# Patient Record
Sex: Female | Born: 1935 | Race: White | Hispanic: No | Marital: Married | Smoking: Former smoker
Health system: Southern US, Community
[De-identification: ages and names within clinical notes are randomized; demographics above are authoritative.]

## PROBLEM LIST (undated history)

## (undated) DIAGNOSIS — K219 Gastro-esophageal reflux disease without esophagitis: Secondary | ICD-10-CM

## (undated) DIAGNOSIS — J310 Chronic rhinitis: Secondary | ICD-10-CM

## (undated) DIAGNOSIS — I499 Cardiac arrhythmia, unspecified: Secondary | ICD-10-CM

## (undated) DIAGNOSIS — M1712 Unilateral primary osteoarthritis, left knee: Secondary | ICD-10-CM

## (undated) DIAGNOSIS — E039 Hypothyroidism, unspecified: Secondary | ICD-10-CM

## (undated) DIAGNOSIS — Z9889 Other specified postprocedural states: Secondary | ICD-10-CM

## (undated) DIAGNOSIS — M1990 Unspecified osteoarthritis, unspecified site: Secondary | ICD-10-CM

## (undated) DIAGNOSIS — I10 Essential (primary) hypertension: Secondary | ICD-10-CM

## (undated) DIAGNOSIS — R112 Nausea with vomiting, unspecified: Secondary | ICD-10-CM

## (undated) HISTORY — DX: Chronic rhinitis: J31.0

## (undated) HISTORY — DX: Essential (primary) hypertension: I10

## (undated) HISTORY — DX: Hypothyroidism, unspecified: E03.9

## (undated) HISTORY — PX: CATARACT EXTRACTION: SUR2

---

## 2001-01-29 ENCOUNTER — Ambulatory Visit (HOSPITAL_COMMUNITY): Admission: RE | Admit: 2001-01-29 | Discharge: 2001-01-29 | Payer: Self-pay | Admitting: Internal Medicine

## 2001-01-29 ENCOUNTER — Encounter: Payer: Self-pay | Admitting: Internal Medicine

## 2001-02-04 ENCOUNTER — Ambulatory Visit (HOSPITAL_COMMUNITY): Admission: RE | Admit: 2001-02-04 | Discharge: 2001-02-04 | Payer: Self-pay | Admitting: Internal Medicine

## 2001-02-04 ENCOUNTER — Encounter: Payer: Self-pay | Admitting: Internal Medicine

## 2001-04-28 ENCOUNTER — Other Ambulatory Visit: Admission: RE | Admit: 2001-04-28 | Discharge: 2001-04-28 | Payer: Self-pay | Admitting: Obstetrics and Gynecology

## 2002-02-15 ENCOUNTER — Ambulatory Visit (HOSPITAL_COMMUNITY): Admission: RE | Admit: 2002-02-15 | Discharge: 2002-02-15 | Payer: Self-pay | Admitting: Specialist

## 2002-02-15 ENCOUNTER — Encounter: Payer: Self-pay | Admitting: Specialist

## 2002-02-16 ENCOUNTER — Encounter: Payer: Self-pay | Admitting: Specialist

## 2002-02-16 ENCOUNTER — Ambulatory Visit (HOSPITAL_COMMUNITY): Admission: RE | Admit: 2002-02-16 | Discharge: 2002-02-16 | Payer: Self-pay | Admitting: Specialist

## 2002-09-15 ENCOUNTER — Encounter: Payer: Self-pay | Admitting: Obstetrics and Gynecology

## 2002-09-15 ENCOUNTER — Ambulatory Visit (HOSPITAL_COMMUNITY): Admission: RE | Admit: 2002-09-15 | Discharge: 2002-09-15 | Payer: Self-pay | Admitting: Obstetrics and Gynecology

## 2002-10-07 HISTORY — PX: TOTAL KNEE ARTHROPLASTY: SHX125

## 2002-12-09 ENCOUNTER — Encounter: Payer: Self-pay | Admitting: Orthopedic Surgery

## 2002-12-15 ENCOUNTER — Inpatient Hospital Stay (HOSPITAL_COMMUNITY): Admission: RE | Admit: 2002-12-15 | Discharge: 2002-12-19 | Payer: Self-pay | Admitting: Orthopedic Surgery

## 2003-05-24 ENCOUNTER — Encounter: Payer: Self-pay | Admitting: Specialist

## 2003-05-24 ENCOUNTER — Ambulatory Visit (HOSPITAL_COMMUNITY): Admission: RE | Admit: 2003-05-24 | Discharge: 2003-05-24 | Payer: Self-pay | Admitting: Specialist

## 2003-08-23 ENCOUNTER — Ambulatory Visit (HOSPITAL_COMMUNITY): Admission: RE | Admit: 2003-08-23 | Discharge: 2003-08-23 | Payer: Self-pay | Admitting: Internal Medicine

## 2004-05-24 ENCOUNTER — Ambulatory Visit (HOSPITAL_COMMUNITY): Admission: RE | Admit: 2004-05-24 | Discharge: 2004-05-24 | Payer: Self-pay | Admitting: Specialist

## 2004-06-15 ENCOUNTER — Ambulatory Visit (HOSPITAL_COMMUNITY): Admission: RE | Admit: 2004-06-15 | Discharge: 2004-06-15 | Payer: Self-pay | Admitting: Internal Medicine

## 2005-06-06 ENCOUNTER — Ambulatory Visit (HOSPITAL_COMMUNITY): Admission: RE | Admit: 2005-06-06 | Discharge: 2005-06-06 | Payer: Self-pay | Admitting: Specialist

## 2005-07-03 ENCOUNTER — Ambulatory Visit (HOSPITAL_COMMUNITY): Admission: RE | Admit: 2005-07-03 | Discharge: 2005-07-03 | Payer: Self-pay | Admitting: Specialist

## 2006-01-10 ENCOUNTER — Ambulatory Visit (HOSPITAL_COMMUNITY): Admission: RE | Admit: 2006-01-10 | Discharge: 2006-01-10 | Payer: Self-pay | Admitting: Internal Medicine

## 2006-07-14 ENCOUNTER — Ambulatory Visit (HOSPITAL_COMMUNITY): Admission: RE | Admit: 2006-07-14 | Discharge: 2006-07-14 | Payer: Self-pay | Admitting: Obstetrics and Gynecology

## 2007-07-28 ENCOUNTER — Ambulatory Visit (HOSPITAL_COMMUNITY): Admission: RE | Admit: 2007-07-28 | Discharge: 2007-07-28 | Payer: Self-pay | Admitting: Obstetrics and Gynecology

## 2007-08-07 ENCOUNTER — Encounter (HOSPITAL_COMMUNITY): Admission: RE | Admit: 2007-08-07 | Discharge: 2007-09-06 | Payer: Self-pay | Admitting: Orthopedic Surgery

## 2007-09-15 ENCOUNTER — Encounter (HOSPITAL_COMMUNITY): Admission: RE | Admit: 2007-09-15 | Discharge: 2007-10-07 | Payer: Self-pay | Admitting: Orthopedic Surgery

## 2007-11-16 ENCOUNTER — Other Ambulatory Visit: Admission: RE | Admit: 2007-11-16 | Discharge: 2007-11-16 | Payer: Self-pay | Admitting: Obstetrics and Gynecology

## 2008-03-17 ENCOUNTER — Ambulatory Visit (HOSPITAL_COMMUNITY): Admission: RE | Admit: 2008-03-17 | Discharge: 2008-03-17 | Payer: Self-pay | Admitting: Internal Medicine

## 2008-08-02 ENCOUNTER — Ambulatory Visit (HOSPITAL_COMMUNITY): Admission: RE | Admit: 2008-08-02 | Discharge: 2008-08-02 | Payer: Self-pay | Admitting: Internal Medicine

## 2008-08-08 ENCOUNTER — Ambulatory Visit (HOSPITAL_COMMUNITY): Admission: RE | Admit: 2008-08-08 | Discharge: 2008-08-08 | Payer: Self-pay | Admitting: Obstetrics and Gynecology

## 2008-11-28 ENCOUNTER — Other Ambulatory Visit: Admission: RE | Admit: 2008-11-28 | Discharge: 2008-11-28 | Payer: Self-pay | Admitting: Obstetrics and Gynecology

## 2009-08-15 ENCOUNTER — Ambulatory Visit (HOSPITAL_COMMUNITY): Admission: RE | Admit: 2009-08-15 | Discharge: 2009-08-15 | Payer: Self-pay | Admitting: Obstetrics and Gynecology

## 2010-03-29 ENCOUNTER — Ambulatory Visit (HOSPITAL_COMMUNITY): Admission: RE | Admit: 2010-03-29 | Discharge: 2010-03-29 | Payer: Self-pay | Admitting: Internal Medicine

## 2010-08-21 ENCOUNTER — Ambulatory Visit (HOSPITAL_COMMUNITY): Admission: RE | Admit: 2010-08-21 | Discharge: 2010-08-21 | Payer: Self-pay | Admitting: Obstetrics and Gynecology

## 2010-10-27 ENCOUNTER — Encounter: Payer: Self-pay | Admitting: Internal Medicine

## 2010-12-19 ENCOUNTER — Other Ambulatory Visit (HOSPITAL_COMMUNITY)
Admission: RE | Admit: 2010-12-19 | Discharge: 2010-12-19 | Disposition: A | Discharge status: Home / Self Care | Payer: Medicare Other | Source: Ambulatory Visit | Attending: Obstetrics and Gynecology | Admitting: Obstetrics and Gynecology

## 2010-12-19 ENCOUNTER — Other Ambulatory Visit: Payer: Self-pay | Admitting: Adult Health

## 2010-12-19 DIAGNOSIS — Z124 Encounter for screening for malignant neoplasm of cervix: Secondary | ICD-10-CM | POA: Insufficient documentation

## 2011-02-22 NOTE — Op Note (Signed)
Evelyn Walker, Evelyn Walker                            ACCOUNT NO.:  [id number]   MEDICAL RECORD NO.:  [id number]                   PATIENT TYPE:  INP   LOCATION:  2550                                 FACILITY:  [facility]   PHYSICIAN:  Robert A. Thurston Hole, M.D.              DATE OF BIRTH:  12/29/1934   DATE OF PROCEDURE:  12/15/2002  DATE OF DISCHARGE:                                 OPERATIVE REPORT   PREOPERATIVE DIAGNOSIS:  Right knee degenerative joint disease.   POSTOPERATIVE DIAGNOSIS:  Right knee degenerative joint disease.   PROCEDURE:  Right total knee replacement using Osteonix Scorpio total knee  system with #7 cemented femoral component, #5 cemented tibial component with  12-mm polyethylene flex tibial spacer, 26-mm polyethylene cemented patella.   SURGEON:  Elana Alm. Thurston Hole, M.D.   ASSISTANT:  Julien Girt, P.A.   ANESTHESIA:  General.   OPERATIVE TIME:  One hour 20 minutes.   COMPLICATIONS:  None.   DESCRIPTION OF PROCEDURE:  The patient was brought to the operating room on  12/15/02 and placed on the operative table in supine position.  A femoral  nerve block had been placed preoperatively by anesthesia in the holding  room.  After being placed under general anesthesia, her right knee was  examined under anesthesia.  She had approximately 12 to 14 degrees of valgus  deformity and range of motion 0 to 125 degrees.  Knee stable to ligamentous  exam.  She had a Foley catheter placed under sterile conditions and received  Ancef 1 g IV preoperatively for prophylaxis.  Right leg was prepped using  sterile DuraPrep and draped using sterile technique.  Leg was exsanguinated  and a thigh tourniquet elevated 375 mm.  Initially, through a 20 cm  longitudinal incision based over the patella, initial exposure was made.  Underlying subcutaneous tissues were incised along with skin incision.  A  median arthrotomy was performed revealing an excessive amount of normal-  appearing  joint fluid.  The articular surfaces were inspected.  She had  grade 3 changes medially, grade 4 changes laterally, grade 3 and 4 changes  in the patellofemoral joint.  The medial and lateral meniscal remnants were  removed as well as anterior crucial ligament.  Osteophytes were removed off  the femoral condyle and tibial plateau.  An intermedullary drill was drilled  over the femoral canal for placement of the distal femoral cutting jig which  was placed in the appropriate amount of rotation and a distal 10 mm cut was  made.  The distal femur was sized.  #7 was found to be the appropriate size  and #7 cutting jig was placed and then these cuts were made.  The proximal  tibia was then exposed.  The tibial spines were removed with an oscillating  saw.  Intermedullary drill was drilled down the tibial canal for placement  of the proximal tibial cutting  jig which was placed in the appropriate  amount of rotation and a proximal 4 mm cut was made based off the lateral or  lower side.  After this was done, the proximal tibia was sized.  #5 was  found to be the appropriate size.  At this point then, the #7 femoral trial  was placed and #5 tibial base plate trial was placed and a with a 12-mm  polyethylene spacer there was found to be excellent restoration of normal  alignment, excellent stability through a full range of motion.  The tibial  base plate was then marked for rotation and the keel cut was made.  After  this was done, the patella was sized.  26 mm was found to be the appropriate  size and a recessed 10 mm x 26 mm cut was made and three locking holes were  placed.  After this was done, it was felt that all the trial components were  of excellent size, fit and stability.  They were removed.  The knee was then  jet lavage  irrigated with three liters of saline solution.  The proximal  tibia was then exposed.  The #5 tibial base plate with cement backing was  then hammered into position with  an excellent fit.  The #7 femoral component  with cement backing was hammered into position also with an excellent fit.  The 12-mm polyethylene spacer was locked on the tibial base plate. The knee  taken through range of motion 0 to 120 degrees with excellent stability and  excellent correction of her valgus deformity to approximately 5 to 7 degrees  of natural valgus inclination.  The 26-mm patella button with cement backing  was placed into its recessed hole and locked in position with a clamp.  After cement hardened, patellofemoral tracking was evaluated and this was  found to be normal.  At this point, it was felt that all the components were  of excellent size, fit and stability.  The knee was further irrigated with  antibiotic solution.  The median arthrotomy was closed with #1 Ethibond  suture over two medium Hemovac drains.  Subcutaneous tissue was closed 0 and  2-0 Vicryl.  Skin closed with skin staples.  Sterile dressings were applied.  The Hemovac injected with 0.25% Marcaine and epinephrine and clamped.  The  patient then awakened and taken to the recovery room in stable condition.  Needle and sponge counts correct x2 at the end of the case.                                                Robert A. Thurston Hole, M.D.    RAW/MEDQ  D:  12/15/2002  T:  12/15/2002  Job:  [id number]

## 2011-02-22 NOTE — Op Note (Signed)
NAME:  Evelyn Walker, Evelyn Walker                            ACCOUNT NO.:  [id number]   MEDICAL RECORD NO.:  [id number]                   PATIENT TYPE:  AMB   LOCATION:  DAY                                  FACILITY:  [facility]   PHYSICIAN:  Lionel December, M.D.                 DATE OF BIRTH:  Aug 02, 1935   DATE OF PROCEDURE:  06/15/2004  DATE OF DISCHARGE:                                 OPERATIVE REPORT   PROCEDURE:  Total colonoscopy with polypectomy.   INDICATIONS FOR PROCEDURE:  Beth is a 76 year old Caucasian female who had  a colonoscopy six years ago in [place] and had one polyp removed.  She is  here for surveillance exam.  Family history is negative for colorectal  carcinoma, and she does not have any GI symptoms.  The procedure risks were  reviewed with the patient, and informed consent was obtained.   PREOPERATIVE MEDICATIONS:  Demerol 50 mg IV, Versed 8 mg IV in divided dose.   FINDINGS:  The procedure was performed in the endoscopy suite.  The  patient's vital signs and O2 saturations were monitored during the procedure  and remained stable.  The patient was placed in the left lateral recumbent  position and rectal examination performed.  No abnormality noted on external  or digital exam.  The Olympus videoscope was placed into the rectum and  advanced into the region of the sigmoid colon and beyond.  She had scattered  diverticula at the sigmoid and descending colon.  These were small to  moderate size.  The preparation was satisfactory.  The scope was then  advanced into the cecum which was identified by the appendiceal orifice and  ileocecal valve.  There was a sessile polyp located to the right of the  appendiceal orifice.  This was over 10 mm in diameter.  This polyp was  raised with a submucosa injection of normal saline, trying to stay away from  the appendiceal orifice.  The polyp was removed in two pieces.  The  polypectomy was felt to be complete.  Both of these pieces of  polyp were  retrieved for histologic examination.  As the scope was withdrawn, the rest  of the colonic mucosa was examined for the second time, and there were no  other abnormalities.  The rectal mucosa similarly was normal.  The scope was  retroflexed to examine the anorectal junction, and she had a prominent anal  papilla.  The endoscope was straightened and withdrawn.  The patient  tolerated the procedure well.   FINAL DIAGNOSES:  1.  Sessile cecal polyp located next to appendiceal orifice which was snared      piecemeal after submucosa injection of normal saline.  Polypectomy felt      to be complete.  2.  Left colonic diverticulosis.  3.  Prominent anal papilla.   RECOMMENDATIONS:  1.  Standard instructions given.  2.  I will be contacting the patient with the biopsy results and further      recommendations.  If this polyp is an adenoma, I would consider bringing      her back in six months from now.      ___________________________________________                                            Lionel December, M.D.   NR/MEDQ  D:  06/15/2004  T:  06/15/2004  Job:  [id number]   cc:   Madelin Rear. Sherwood Gambler, M.D.  [address]  [place]  [place] [address]  Fax: [phone]

## 2011-02-22 NOTE — Discharge Summary (Signed)
NAMEBANESSA, MAO                            ACCOUNT NO.:  [id number]   MEDICAL RECORD NO.:  [id number]                   PATIENT TYPE:  INP   LOCATION:  5009                                 FACILITY:  [facility]   PHYSICIAN:  Robert A. Thurston Hole, M.D.              DATE OF BIRTH:  12/13/1934   DATE OF ADMISSION:  12/15/2002  DATE OF DISCHARGE:  12/19/2002                                 DISCHARGE SUMMARY   ADMISSION DIAGNOSES:  1. End-stage degenerative joint disease, right knee.  2. Hypothyroidism.   DISCHARGE DIAGNOSES:  1. End-stage degenerative joint disease, right knee.  2. Hypothyroidism.   HISTORY OF PRESENT ILLNESS:  The patient is a 76 year old white female with  a history of end-stage degenerative joint disease of her right knee.  She  tried antiinflammatories, cortisone injections, all without success.  She  has pain at night, pain with rest, unrelieved by any medication.  She  understands the risks, benefits and possible complications of a right total  knee replacement and is without question.   PROCEDURES INHOUSE:  On 12/15/02 the patient underwent a right total knee  replacement by Dr. Thurston Hole.  Anesthesia was consulted to place a right leg  femoral nerve block.  Postoperative day zero she was placed on a CPM for  eight hours a day, zero to 50 degrees.  Postoperative day one hemoglobin was  9.1, she was metabolically stable.  Her INR was 1.1.  Surgical wound was  well approximated.  Her drain was removed.  Her dressing was changed.  Her  PCA was discontinued and she was placed on Percocet for pain.  Postoperative  day two the patient was doing well, she had some difficulty with pain  control, OxyContin 10 mg one p.o. b.i.d. was added to her regimen.  She was  hypokalemic with a potassium of 3.1 and she was given K-Dur 20 mEq b.i.d.  Postoperative day three the patient was doing very well, significantly  better on OxyContin 10 mg b.i.d.  Her hemoglobin was 8.1, she was  asymptomatic.  She was continued on iron.  She was given a Dulcolax  suppository.  Postoperative day four patient was still constipated, she was  given a Fleet's Enema and discharged to home in stable condition, weight  bearing as tolerated.  She was on a regular diet, surgical wound was clean  and dry.   DISCHARGE MEDICATIONS:  Colace 100 mg one p.o. b.i.d., Senokot-S 2 tablets  p.o. q.a.c. b.i.d., OxyContin 10 mg one p.o. b.i.d., Oxy-IR 5 mg 1-2 tablets  q.3-4h p.r.n. pain, Coumadin 3 mg one tablet daily, iron supplement one  tablet p.o. times one month.   DISPOSITION:  We will see her back in the office in 1.5 weeks to remove  staples, and x-rays.  She will call if she has a temperature greater than  101.5, increased pain, increased drainage.  She will be  receiving Home  Health physical therapy and CPM.  A walker with wheels, and a three-in-one  commode.       Kirstin Shepperson, P.A.                  Robert A. Thurston Hole, M.D.    KS/MEDQ  D:  01/19/2003  T:  01/19/2003  Job:  [id number]

## 2011-09-02 ENCOUNTER — Other Ambulatory Visit: Payer: Self-pay | Admitting: Obstetrics and Gynecology

## 2011-09-02 DIAGNOSIS — Z139 Encounter for screening, unspecified: Secondary | ICD-10-CM

## 2011-09-09 ENCOUNTER — Ambulatory Visit (HOSPITAL_COMMUNITY)
Admission: RE | Admit: 2011-09-09 | Discharge: 2011-09-09 | Disposition: A | Discharge status: Home / Self Care | Payer: Medicare Other | Source: Ambulatory Visit | Attending: Obstetrics and Gynecology | Admitting: Obstetrics and Gynecology

## 2011-09-09 DIAGNOSIS — Z139 Encounter for screening, unspecified: Secondary | ICD-10-CM

## 2011-09-09 DIAGNOSIS — Z1231 Encounter for screening mammogram for malignant neoplasm of breast: Secondary | ICD-10-CM | POA: Insufficient documentation

## 2011-12-26 DIAGNOSIS — D235 Other benign neoplasm of skin of trunk: Secondary | ICD-10-CM | POA: Diagnosis not present

## 2012-02-18 DIAGNOSIS — M2140 Flat foot [pes planus] (acquired), unspecified foot: Secondary | ICD-10-CM | POA: Diagnosis not present

## 2012-02-18 DIAGNOSIS — M2020 Hallux rigidus, unspecified foot: Secondary | ICD-10-CM | POA: Diagnosis not present

## 2012-03-10 DIAGNOSIS — M1710 Unilateral primary osteoarthritis, unspecified knee: Secondary | ICD-10-CM | POA: Diagnosis not present

## 2012-03-31 DIAGNOSIS — M76829 Posterior tibial tendinitis, unspecified leg: Secondary | ICD-10-CM | POA: Diagnosis not present

## 2012-04-06 DIAGNOSIS — E039 Hypothyroidism, unspecified: Secondary | ICD-10-CM | POA: Diagnosis not present

## 2012-04-06 DIAGNOSIS — M1990 Unspecified osteoarthritis, unspecified site: Secondary | ICD-10-CM | POA: Diagnosis not present

## 2012-04-06 DIAGNOSIS — Z79899 Other long term (current) drug therapy: Secondary | ICD-10-CM | POA: Diagnosis not present

## 2012-04-14 DIAGNOSIS — M1990 Unspecified osteoarthritis, unspecified site: Secondary | ICD-10-CM | POA: Diagnosis not present

## 2012-04-14 DIAGNOSIS — E039 Hypothyroidism, unspecified: Secondary | ICD-10-CM | POA: Diagnosis not present

## 2012-04-14 DIAGNOSIS — K219 Gastro-esophageal reflux disease without esophagitis: Secondary | ICD-10-CM | POA: Diagnosis not present

## 2012-05-12 DIAGNOSIS — I10 Essential (primary) hypertension: Secondary | ICD-10-CM | POA: Diagnosis not present

## 2012-05-12 DIAGNOSIS — E7800 Pure hypercholesterolemia, unspecified: Secondary | ICD-10-CM | POA: Diagnosis not present

## 2012-05-12 DIAGNOSIS — E673 Hypervitaminosis D: Secondary | ICD-10-CM | POA: Diagnosis not present

## 2012-05-12 DIAGNOSIS — M159 Polyosteoarthritis, unspecified: Secondary | ICD-10-CM | POA: Diagnosis not present

## 2012-06-09 DIAGNOSIS — M25569 Pain in unspecified knee: Secondary | ICD-10-CM | POA: Diagnosis not present

## 2012-08-07 DIAGNOSIS — Z23 Encounter for immunization: Secondary | ICD-10-CM | POA: Diagnosis not present

## 2012-08-13 DIAGNOSIS — L259 Unspecified contact dermatitis, unspecified cause: Secondary | ICD-10-CM | POA: Diagnosis not present

## 2012-08-13 DIAGNOSIS — D235 Other benign neoplasm of skin of trunk: Secondary | ICD-10-CM | POA: Diagnosis not present

## 2012-08-24 ENCOUNTER — Other Ambulatory Visit (HOSPITAL_COMMUNITY): Payer: Self-pay | Admitting: Internal Medicine

## 2012-08-24 DIAGNOSIS — IMO0001 Reserved for inherently not codable concepts without codable children: Secondary | ICD-10-CM

## 2012-08-26 DIAGNOSIS — H26499 Other secondary cataract, unspecified eye: Secondary | ICD-10-CM | POA: Diagnosis not present

## 2012-09-14 ENCOUNTER — Ambulatory Visit (HOSPITAL_COMMUNITY): Payer: PRIVATE HEALTH INSURANCE

## 2012-09-17 DIAGNOSIS — M25579 Pain in unspecified ankle and joints of unspecified foot: Secondary | ICD-10-CM | POA: Diagnosis not present

## 2012-09-17 DIAGNOSIS — M2040 Other hammer toe(s) (acquired), unspecified foot: Secondary | ICD-10-CM | POA: Diagnosis not present

## 2012-09-17 DIAGNOSIS — M2010 Hallux valgus (acquired), unspecified foot: Secondary | ICD-10-CM | POA: Diagnosis not present

## 2012-09-17 DIAGNOSIS — M79609 Pain in unspecified limb: Secondary | ICD-10-CM | POA: Diagnosis not present

## 2012-09-21 ENCOUNTER — Ambulatory Visit (HOSPITAL_COMMUNITY): Payer: PRIVATE HEALTH INSURANCE

## 2012-09-22 ENCOUNTER — Ambulatory Visit (HOSPITAL_COMMUNITY): Payer: PRIVATE HEALTH INSURANCE

## 2012-10-05 ENCOUNTER — Ambulatory Visit (HOSPITAL_COMMUNITY)
Admission: RE | Admit: 2012-10-05 | Discharge: 2012-10-05 | Disposition: A | Discharge status: Home / Self Care | Payer: Medicare Other | Source: Ambulatory Visit | Attending: Internal Medicine | Admitting: Internal Medicine

## 2012-10-05 DIAGNOSIS — IMO0001 Reserved for inherently not codable concepts without codable children: Secondary | ICD-10-CM

## 2012-10-05 DIAGNOSIS — Z1231 Encounter for screening mammogram for malignant neoplasm of breast: Secondary | ICD-10-CM | POA: Insufficient documentation

## 2012-11-13 DIAGNOSIS — I10 Essential (primary) hypertension: Secondary | ICD-10-CM | POA: Diagnosis not present

## 2012-11-13 DIAGNOSIS — E7800 Pure hypercholesterolemia, unspecified: Secondary | ICD-10-CM | POA: Diagnosis not present

## 2012-11-13 DIAGNOSIS — E673 Hypervitaminosis D: Secondary | ICD-10-CM | POA: Diagnosis not present

## 2012-11-13 DIAGNOSIS — M159 Polyosteoarthritis, unspecified: Secondary | ICD-10-CM | POA: Diagnosis not present

## 2012-11-24 DIAGNOSIS — R6889 Other general symptoms and signs: Secondary | ICD-10-CM | POA: Diagnosis not present

## 2012-11-24 DIAGNOSIS — E559 Vitamin D deficiency, unspecified: Secondary | ICD-10-CM | POA: Diagnosis not present

## 2013-02-24 ENCOUNTER — Encounter: Payer: Self-pay | Admitting: *Deleted

## 2013-02-24 DIAGNOSIS — E039 Hypothyroidism, unspecified: Secondary | ICD-10-CM

## 2013-02-25 ENCOUNTER — Ambulatory Visit (INDEPENDENT_AMBULATORY_CARE_PROVIDER_SITE_OTHER): Payer: Medicare Other | Admitting: Adult Health

## 2013-02-25 ENCOUNTER — Other Ambulatory Visit (HOSPITAL_COMMUNITY)
Admission: RE | Admit: 2013-02-25 | Discharge: 2013-02-25 | Disposition: A | Discharge status: Home / Self Care | Payer: Medicare Other | Source: Ambulatory Visit | Attending: Adult Health | Admitting: Adult Health

## 2013-02-25 ENCOUNTER — Encounter: Payer: Self-pay | Admitting: Adult Health

## 2013-02-25 VITALS — BP 148/90 | HR 80 | Ht 65.0 in | Wt 155.5 lb

## 2013-02-25 DIAGNOSIS — Z1151 Encounter for screening for human papillomavirus (HPV): Secondary | ICD-10-CM | POA: Diagnosis not present

## 2013-02-25 DIAGNOSIS — Z01419 Encounter for gynecological examination (general) (routine) without abnormal findings: Secondary | ICD-10-CM | POA: Insufficient documentation

## 2013-02-25 DIAGNOSIS — Z124 Encounter for screening for malignant neoplasm of cervix: Secondary | ICD-10-CM

## 2013-02-25 DIAGNOSIS — N898 Other specified noninflammatory disorders of vagina: Secondary | ICD-10-CM

## 2013-02-25 DIAGNOSIS — Z1212 Encounter for screening for malignant neoplasm of rectum: Secondary | ICD-10-CM

## 2013-02-25 DIAGNOSIS — E039 Hypothyroidism, unspecified: Secondary | ICD-10-CM

## 2013-02-25 LAB — HEMOCCULT GUIAC POC 1CARD (OFFICE)

## 2013-02-25 NOTE — Patient Instructions (Addendum)
Try luvena Mammogram yearly Physical in 2 years Colonoscopy 2015 Labs at Dr. Ouida Sills

## 2013-02-25 NOTE — Progress Notes (Signed)
Patient ID: Evelyn Walker, female   DOB: May 24, 1935, 78 y.o.   MRN: [id number] History of Present Illness: Evelyn Walker is a 78 year old white female in for her pap and physical.  Current Medications, Allergies, Past Medical History, Past Surgical History, Family History and Social History were reviewed in [employer] [facility] electronic medical record.    Review of Systems: Patient denies any headaches, blurred vision, shortness of breath, chest pain, abdominal pain, problems with bowel movements, urination, or intercourse.But she has some vaginal dryness.she has used replens. She had back discomfort but its better now, no mood changes.  Physical Exam:Blood pressure 148/90, pulse 80, height 5\' 5"  (1.651 m), weight 155 lb 8 oz (70.534 kg). General:  Well developed, well nourished, no acute distress Skin:  Warm and dry Neck:  Midline trachea, normal thyroid, no carotid bruits heard Lungs; Clear to auscultation bilaterally Breast:  No dominant palpable mass, retraction, or nipple discharge Cardiovascular: Regular rate and rhythm Abdomen:  Soft, non tender, no hepatosplenomegaly Pelvic:  External genitalia is normal in appearance for age.  The vagina is atrophic. The cervix is atrophic.Pap performed with HPV.  Uterus is felt to be normal size, shape, and contour.  No adnexal masses or tenderness noted. Rectal: Good sphincter tone, no polyps, or hemorrhoids felt.  Hemoccult negative. Extremities:  No swelling  noted Psych:  Alert and cooperative, seems happy  Impression: Yearly gyn exam Vaginal dryness and atrophy Hypothyroidism  Plan: Physical in 2 years Mammogram yearly  Colonoscopy in 2015 Labs with Dr. Ouida Sills Try Leatrice Jewels for vaginal moisture and to balance ph

## 2013-03-12 DIAGNOSIS — H26499 Other secondary cataract, unspecified eye: Secondary | ICD-10-CM | POA: Diagnosis not present

## 2013-03-19 DIAGNOSIS — M159 Polyosteoarthritis, unspecified: Secondary | ICD-10-CM | POA: Diagnosis not present

## 2013-03-19 DIAGNOSIS — N959 Unspecified menopausal and perimenopausal disorder: Secondary | ICD-10-CM | POA: Diagnosis not present

## 2013-03-19 DIAGNOSIS — I10 Essential (primary) hypertension: Secondary | ICD-10-CM | POA: Diagnosis not present

## 2013-03-19 DIAGNOSIS — E7800 Pure hypercholesterolemia, unspecified: Secondary | ICD-10-CM | POA: Diagnosis not present

## 2013-04-13 DIAGNOSIS — I10 Essential (primary) hypertension: Secondary | ICD-10-CM | POA: Diagnosis not present

## 2013-04-13 DIAGNOSIS — T7840XA Allergy, unspecified, initial encounter: Secondary | ICD-10-CM | POA: Diagnosis not present

## 2013-04-13 DIAGNOSIS — Z79899 Other long term (current) drug therapy: Secondary | ICD-10-CM | POA: Diagnosis not present

## 2013-04-13 DIAGNOSIS — E039 Hypothyroidism, unspecified: Secondary | ICD-10-CM | POA: Diagnosis not present

## 2013-04-13 DIAGNOSIS — M1990 Unspecified osteoarthritis, unspecified site: Secondary | ICD-10-CM | POA: Diagnosis not present

## 2013-04-20 ENCOUNTER — Other Ambulatory Visit (HOSPITAL_COMMUNITY): Payer: Self-pay | Admitting: Internal Medicine

## 2013-04-20 DIAGNOSIS — E039 Hypothyroidism, unspecified: Secondary | ICD-10-CM | POA: Diagnosis not present

## 2013-04-20 DIAGNOSIS — M8580 Other specified disorders of bone density and structure, unspecified site: Secondary | ICD-10-CM

## 2013-04-20 DIAGNOSIS — M1990 Unspecified osteoarthritis, unspecified site: Secondary | ICD-10-CM | POA: Diagnosis not present

## 2013-05-10 ENCOUNTER — Ambulatory Visit (HOSPITAL_COMMUNITY)
Admission: RE | Admit: 2013-05-10 | Discharge: 2013-05-10 | Disposition: A | Discharge status: Home / Self Care | Payer: Medicare Other | Source: Ambulatory Visit | Attending: Internal Medicine | Admitting: Internal Medicine

## 2013-05-10 DIAGNOSIS — Z780 Asymptomatic menopausal state: Secondary | ICD-10-CM | POA: Insufficient documentation

## 2013-05-10 DIAGNOSIS — M899 Disorder of bone, unspecified: Secondary | ICD-10-CM | POA: Diagnosis not present

## 2013-05-10 DIAGNOSIS — M8580 Other specified disorders of bone density and structure, unspecified site: Secondary | ICD-10-CM

## 2013-05-10 DIAGNOSIS — M949 Disorder of cartilage, unspecified: Secondary | ICD-10-CM | POA: Diagnosis not present

## 2013-07-14 DIAGNOSIS — N951 Menopausal and female climacteric states: Secondary | ICD-10-CM | POA: Diagnosis not present

## 2013-07-14 DIAGNOSIS — E559 Vitamin D deficiency, unspecified: Secondary | ICD-10-CM | POA: Diagnosis not present

## 2013-08-02 DIAGNOSIS — N951 Menopausal and female climacteric states: Secondary | ICD-10-CM | POA: Diagnosis not present

## 2013-08-05 DIAGNOSIS — M2040 Other hammer toe(s) (acquired), unspecified foot: Secondary | ICD-10-CM | POA: Diagnosis not present

## 2013-08-05 DIAGNOSIS — M79609 Pain in unspecified limb: Secondary | ICD-10-CM | POA: Diagnosis not present

## 2013-08-12 DIAGNOSIS — Z23 Encounter for immunization: Secondary | ICD-10-CM | POA: Diagnosis not present

## 2013-09-06 DIAGNOSIS — N951 Menopausal and female climacteric states: Secondary | ICD-10-CM | POA: Diagnosis not present

## 2013-10-04 ENCOUNTER — Other Ambulatory Visit (HOSPITAL_COMMUNITY): Payer: Self-pay | Admitting: Internal Medicine

## 2013-10-04 DIAGNOSIS — Z139 Encounter for screening, unspecified: Secondary | ICD-10-CM

## 2013-10-11 ENCOUNTER — Ambulatory Visit (HOSPITAL_COMMUNITY)
Admission: RE | Admit: 2013-10-11 | Discharge: 2013-10-11 | Disposition: A | Discharge status: Home / Self Care | Payer: Medicare Other | Source: Ambulatory Visit | Attending: Internal Medicine | Admitting: Internal Medicine

## 2013-10-11 DIAGNOSIS — Z1231 Encounter for screening mammogram for malignant neoplasm of breast: Secondary | ICD-10-CM | POA: Insufficient documentation

## 2013-10-11 DIAGNOSIS — Z139 Encounter for screening, unspecified: Secondary | ICD-10-CM

## 2013-11-01 DIAGNOSIS — N951 Menopausal and female climacteric states: Secondary | ICD-10-CM | POA: Diagnosis not present

## 2013-11-01 DIAGNOSIS — E039 Hypothyroidism, unspecified: Secondary | ICD-10-CM | POA: Diagnosis not present

## 2013-11-04 DIAGNOSIS — H26499 Other secondary cataract, unspecified eye: Secondary | ICD-10-CM | POA: Diagnosis not present

## 2013-12-27 DIAGNOSIS — E039 Hypothyroidism, unspecified: Secondary | ICD-10-CM | POA: Diagnosis not present

## 2013-12-27 DIAGNOSIS — N951 Menopausal and female climacteric states: Secondary | ICD-10-CM | POA: Diagnosis not present

## 2014-01-17 DIAGNOSIS — N951 Menopausal and female climacteric states: Secondary | ICD-10-CM | POA: Diagnosis not present

## 2014-01-17 DIAGNOSIS — E039 Hypothyroidism, unspecified: Secondary | ICD-10-CM | POA: Diagnosis not present

## 2014-04-12 DIAGNOSIS — M25569 Pain in unspecified knee: Secondary | ICD-10-CM | POA: Diagnosis not present

## 2014-04-15 DIAGNOSIS — Z79899 Other long term (current) drug therapy: Secondary | ICD-10-CM | POA: Diagnosis not present

## 2014-04-15 DIAGNOSIS — I10 Essential (primary) hypertension: Secondary | ICD-10-CM | POA: Diagnosis not present

## 2014-04-15 DIAGNOSIS — T7840XA Allergy, unspecified, initial encounter: Secondary | ICD-10-CM | POA: Diagnosis not present

## 2014-04-15 DIAGNOSIS — E039 Hypothyroidism, unspecified: Secondary | ICD-10-CM | POA: Diagnosis not present

## 2014-04-15 DIAGNOSIS — M1990 Unspecified osteoarthritis, unspecified site: Secondary | ICD-10-CM | POA: Diagnosis not present

## 2014-04-22 DIAGNOSIS — Z0000 Encounter for general adult medical examination without abnormal findings: Secondary | ICD-10-CM | POA: Diagnosis not present

## 2014-04-22 DIAGNOSIS — E039 Hypothyroidism, unspecified: Secondary | ICD-10-CM | POA: Diagnosis not present

## 2014-04-22 DIAGNOSIS — Z23 Encounter for immunization: Secondary | ICD-10-CM | POA: Diagnosis not present

## 2014-04-25 DIAGNOSIS — N951 Menopausal and female climacteric states: Secondary | ICD-10-CM | POA: Diagnosis not present

## 2014-04-25 DIAGNOSIS — E559 Vitamin D deficiency, unspecified: Secondary | ICD-10-CM | POA: Diagnosis not present

## 2014-04-25 DIAGNOSIS — E039 Hypothyroidism, unspecified: Secondary | ICD-10-CM | POA: Diagnosis not present

## 2014-04-28 DIAGNOSIS — M79609 Pain in unspecified limb: Secondary | ICD-10-CM | POA: Diagnosis not present

## 2014-04-28 DIAGNOSIS — M2040 Other hammer toe(s) (acquired), unspecified foot: Secondary | ICD-10-CM | POA: Diagnosis not present

## 2014-05-02 DIAGNOSIS — N951 Menopausal and female climacteric states: Secondary | ICD-10-CM | POA: Diagnosis not present

## 2014-05-12 ENCOUNTER — Encounter (INDEPENDENT_AMBULATORY_CARE_PROVIDER_SITE_OTHER): Payer: Self-pay | Admitting: *Deleted

## 2014-05-25 DIAGNOSIS — H43399 Other vitreous opacities, unspecified eye: Secondary | ICD-10-CM | POA: Diagnosis not present

## 2014-05-25 DIAGNOSIS — H26499 Other secondary cataract, unspecified eye: Secondary | ICD-10-CM | POA: Diagnosis not present

## 2014-06-06 DIAGNOSIS — H26499 Other secondary cataract, unspecified eye: Secondary | ICD-10-CM | POA: Diagnosis not present

## 2014-06-10 ENCOUNTER — Encounter (INDEPENDENT_AMBULATORY_CARE_PROVIDER_SITE_OTHER): Payer: Self-pay | Admitting: *Deleted

## 2014-06-10 ENCOUNTER — Telehealth (INDEPENDENT_AMBULATORY_CARE_PROVIDER_SITE_OTHER): Payer: Self-pay | Admitting: *Deleted

## 2014-06-10 ENCOUNTER — Other Ambulatory Visit (INDEPENDENT_AMBULATORY_CARE_PROVIDER_SITE_OTHER): Payer: Self-pay | Admitting: *Deleted

## 2014-06-10 DIAGNOSIS — Z86010 Personal history of colonic polyps: Secondary | ICD-10-CM

## 2014-06-10 DIAGNOSIS — Z1211 Encounter for screening for malignant neoplasm of colon: Secondary | ICD-10-CM

## 2014-06-10 MED ORDER — PEG-KCL-NACL-NASULF-NA ASC-C 100 G PO SOLR: 1.0000 | Freq: Once | ORAL | Status: DC

## 2014-06-10 NOTE — Telephone Encounter (Signed)
Patient needs movi prep 

## 2014-07-01 ENCOUNTER — Telehealth (INDEPENDENT_AMBULATORY_CARE_PROVIDER_SITE_OTHER): Payer: Self-pay | Admitting: *Deleted

## 2014-07-01 NOTE — Telephone Encounter (Signed)
PCP: fagan   Procedure: tcs  Reason/Indication:  Hx polyps  Has patient had this procedure before?  Yes, 2005 -- epic  If so, when, by whom and where?    Is there a family history of colon cancer?  no  Who?  What age when diagnosed?    Is patient diabetic?   no      Does patient have prosthetic heart valve?  no  Do you have a pacemaker?  no  Has patient ever had endocarditis? no  Has patient had joint replacement within last 12 months?  no  Does patient tend to be constipated or take laxatives? no  Is patient on Coumadin, Plavix and/or Aspirin? yes  Medications: asa 81 mg 3 times a week, co q 10 daily, synthroid 125 mcg daily, flonase nasal spray, omeprazole 40 mg daily, amoxicillin before dental procedure, aleve bid, fish oil, vit d 3, hair skin & nail, probiotic  Allergies: nkda  Medication Adjustment: asa & aleve 2 days before  Procedure date & time: 07/27/14 at 100

## 2014-07-04 NOTE — Telephone Encounter (Signed)
agree

## 2014-07-12 ENCOUNTER — Encounter (HOSPITAL_COMMUNITY): Payer: Self-pay | Admitting: Pharmacy Technician

## 2014-07-18 DIAGNOSIS — Z23 Encounter for immunization: Secondary | ICD-10-CM | POA: Diagnosis not present

## 2014-07-25 DIAGNOSIS — N951 Menopausal and female climacteric states: Secondary | ICD-10-CM | POA: Diagnosis not present

## 2014-07-25 DIAGNOSIS — E28310 Symptomatic premature menopause: Secondary | ICD-10-CM | POA: Diagnosis not present

## 2014-07-25 DIAGNOSIS — E063 Autoimmune thyroiditis: Secondary | ICD-10-CM | POA: Diagnosis not present

## 2014-07-25 DIAGNOSIS — R5383 Other fatigue: Secondary | ICD-10-CM | POA: Diagnosis not present

## 2014-07-25 DIAGNOSIS — E3450 Androgen insensitivity syndrome, unspecified: Secondary | ICD-10-CM | POA: Diagnosis not present

## 2014-07-25 DIAGNOSIS — E039 Hypothyroidism, unspecified: Secondary | ICD-10-CM | POA: Diagnosis not present

## 2014-07-27 ENCOUNTER — Encounter (HOSPITAL_COMMUNITY)
Admission: RE | Disposition: A | Discharge status: Home / Self Care | Payer: Self-pay | Source: Ambulatory Visit | Attending: Internal Medicine

## 2014-07-27 ENCOUNTER — Encounter (HOSPITAL_COMMUNITY): Payer: Self-pay | Admitting: *Deleted

## 2014-07-27 ENCOUNTER — Ambulatory Visit (HOSPITAL_COMMUNITY)
Admission: RE | Admit: 2014-07-27 | Discharge: 2014-07-27 | Disposition: A | Discharge status: Home / Self Care | Payer: Medicare Other | Source: Ambulatory Visit | Attending: Internal Medicine | Admitting: Internal Medicine

## 2014-07-27 DIAGNOSIS — K219 Gastro-esophageal reflux disease without esophagitis: Secondary | ICD-10-CM | POA: Insufficient documentation

## 2014-07-27 DIAGNOSIS — Z791 Long term (current) use of non-steroidal anti-inflammatories (NSAID): Secondary | ICD-10-CM | POA: Insufficient documentation

## 2014-07-27 DIAGNOSIS — E039 Hypothyroidism, unspecified: Secondary | ICD-10-CM | POA: Insufficient documentation

## 2014-07-27 DIAGNOSIS — K5730 Diverticulosis of large intestine without perforation or abscess without bleeding: Secondary | ICD-10-CM | POA: Insufficient documentation

## 2014-07-27 DIAGNOSIS — Z7982 Long term (current) use of aspirin: Secondary | ICD-10-CM | POA: Insufficient documentation

## 2014-07-27 DIAGNOSIS — Z86010 Personal history of colonic polyps: Secondary | ICD-10-CM | POA: Insufficient documentation

## 2014-07-27 DIAGNOSIS — Z87891 Personal history of nicotine dependence: Secondary | ICD-10-CM | POA: Diagnosis not present

## 2014-07-27 DIAGNOSIS — Z79899 Other long term (current) drug therapy: Secondary | ICD-10-CM | POA: Insufficient documentation

## 2014-07-27 DIAGNOSIS — K635 Polyp of colon: Secondary | ICD-10-CM | POA: Diagnosis not present

## 2014-07-27 DIAGNOSIS — D120 Benign neoplasm of cecum: Secondary | ICD-10-CM | POA: Diagnosis not present

## 2014-07-27 DIAGNOSIS — M1990 Unspecified osteoarthritis, unspecified site: Secondary | ICD-10-CM | POA: Diagnosis not present

## 2014-07-27 DIAGNOSIS — Z7951 Long term (current) use of inhaled steroids: Secondary | ICD-10-CM | POA: Insufficient documentation

## 2014-07-27 DIAGNOSIS — K649 Unspecified hemorrhoids: Secondary | ICD-10-CM | POA: Diagnosis not present

## 2014-07-27 DIAGNOSIS — Z1211 Encounter for screening for malignant neoplasm of colon: Secondary | ICD-10-CM | POA: Insufficient documentation

## 2014-07-27 HISTORY — DX: Unspecified osteoarthritis, unspecified site: M19.90

## 2014-07-27 HISTORY — DX: Gastro-esophageal reflux disease without esophagitis: K21.9

## 2014-07-27 HISTORY — PX: COLONOSCOPY: SHX5424

## 2014-07-27 SURGERY — COLONOSCOPY
Anesthesia: Moderate Sedation

## 2014-07-27 MED ORDER — MIDAZOLAM HCL 5 MG/5ML IJ SOLN
INTRAMUSCULAR | Status: DC | PRN
  Administered 2014-07-27 (×2): 2 mg via INTRAVENOUS
  Administered 2014-07-27 (×2): 1 mg via INTRAVENOUS
  Administered 2014-07-27: 2 mg via INTRAVENOUS

## 2014-07-27 MED ORDER — MEPERIDINE HCL 50 MG/ML IJ SOLN
INTRAMUSCULAR | Status: AC
  Filled 2014-07-27: qty 1

## 2014-07-27 MED ORDER — SODIUM CHLORIDE 0.9 % IV SOLN
INTRAVENOUS | Status: DC
  Administered 2014-07-27: 12:00:00 via INTRAVENOUS

## 2014-07-27 MED ORDER — MEPERIDINE HCL 50 MG/ML IJ SOLN
INTRAMUSCULAR | Status: DC | PRN
  Administered 2014-07-27 (×2): 25 mg via INTRAVENOUS

## 2014-07-27 MED ORDER — MIDAZOLAM HCL 5 MG/5ML IJ SOLN
INTRAMUSCULAR | Status: AC
  Filled 2014-07-27: qty 10

## 2014-07-27 MED ORDER — SIMETHICONE 40 MG/0.6ML PO SUSP
ORAL | Status: DC | PRN
  Administered 2014-07-27: 13:00:00

## 2014-07-27 NOTE — H&P (Signed)
Evelyn Walker is an 79 y.o. female.   Chief Complaint: Patient's here for colonoscopy. HPI: Patient is a 79 year old Caucasian female who has history of colonic polyps and is here for colonoscopy. She had 1 small adenoma removed at 18 years ago. Last colonoscopy was 10 years ago with removal of single polyp which was not an adenoma. She denies abdominal pain or rectal bleeding. She has history of IBS and has intermittent diarrhea. Family history is negative for CRC. Her mother however died in her 10s because of hemorrhagic CVA and father died at 13 of heart problems.  Past Medical History  Diagnosis Date  . Hypothyroidism   . Vaginal dryness 02/25/2013  . GERD (gastroesophageal reflux disease)   . Arthritis     Past Surgical History  Procedure Laterality Date  . Joint replacement Right     knee    Family History  Problem Relation Age of Onset  . Stroke Mother   . Heart attack Father   . Cancer Paternal Grandfather     breast  . Diabetes Paternal Grandfather   . Colon cancer Neg Hx    Social History:  reports that she has quit smoking. Her smoking use included Cigarettes. She has a 2.5 pack-year smoking history. She has never used smokeless tobacco. She reports that she drinks alcohol. She reports that she does not use illicit drugs.  Allergies: No Known Allergies  Medications Prior to Admission  Medication Sig Dispense Refill  . acetaminophen (TYLENOL) 500 MG tablet Take 500 mg by mouth every 6 (six) hours as needed.      Marland Kitchen aspirin EC 81 MG tablet Take 81 mg by mouth every Monday, Wednesday, and Friday.      Marland Kitchen BIOTIN PO Take 1 tablet by mouth daily. For hair, skin, and nails      . cetirizine (ZYRTEC) 10 MG tablet Take 10 mg by mouth daily.      . Cholecalciferol (VITAMIN D-3) 5000 UNITS TABS Take 1 tablet by mouth daily.      . fluticasone (FLONASE) 50 MCG/ACT nasal spray Place 2 sprays into both nostrils daily.      Marland Kitchen levothyroxine (SYNTHROID, LEVOTHROID) 125 MCG tablet Take  125 mcg by mouth daily before breakfast.       . naproxen sodium (ALEVE) 220 MG tablet Take 220 mg by mouth 2 (two) times daily with a meal.      . omeprazole (PRILOSEC) 40 MG capsule Take 40 mg by mouth daily.      . peg 3350 powder (MOVIPREP) 100 G SOLR Take 1 kit (200 g total) by mouth once.  1 kit  0  . PRESCRIPTION MEDICATION Take 1 application by mouth daily. [employer] compound.  Bi-est cream.        No results found for this or any previous visit (from the past 48 hour(s)). No results found.  ROS  Blood pressure 155/70, pulse 85, temperature 98.4 F (36.9 C), temperature source Oral, resp. rate 18, height _0  (1.651 m), weight 150 lb (68.04 kg), SpO2 98.00%. Physical Exam  Constitutional: She appears well-developed and well-nourished.  HENT:  Mouth/Throat: Oropharynx is clear and moist.  Eyes: Conjunctivae are normal. No scleral icterus.  Neck: No thyromegaly present.  Cardiovascular: Normal rate, regular rhythm and normal heart sounds.   No murmur heard. GI: Soft. She exhibits no distension and no mass. There is no tenderness.  Musculoskeletal: She exhibits no edema.  Lymphadenopathy:    She has no cervical  adenopathy.  Neurological: She is alert.  Skin: Skin is warm and dry.     Assessment/Plan History of colonic polyps. Surveillance colonoscopy.  Lotus Santillo U 07/27/2014, 12:50 PM

## 2014-07-27 NOTE — Op Note (Signed)
COLONOSCOPY PROCEDURE REPORT  PATIENT:  Evelyn Walker  MR#:  [id number] Birthdate:  1935/09/14, 79 y.o., female Endoscopist:  Dr. Rogene Houston, MD Referred By:  Dr. Asencion Noble, MD  Procedure Date: 07/27/2014  Procedure:   Colonoscopy  Indications:  Patient is 79 year old Caucasian female with history of colonic polyps who is here for surveillance colonoscopy. She denies rectal bleeding or abdominal pain. She has history of IBS intermittent diarrhea.  Informed Consent:  The procedure and risks were reviewed with the patient and informed consent was obtained.  Medications:  Demerol 50 mg IV Versed 8 mg IV  Description of procedure:  After a digital rectal exam was performed, that colonoscope was advanced from the anus through the rectum and colon to the area of the cecum, ileocecal valve and appendiceal orifice. The cecum was deeply intubated. These structures were well-seen and photographed for the record. From the level of the cecum and ileocecal valve, the scope was slowly and cautiously withdrawn. The mucosal surfaces were carefully surveyed utilizing scope tip to flexion to facilitate fold flattening as needed. The scope was pulled down into the rectum where a thorough exam including retroflexion was performed.  Findings:   Prep satisfactory. Small sessile polyp noted next appendiceal orifice. Maximal diameter estimated to be 10 mm. Polyp was biopsied for histology and the rest of the polyp was carefully ablated with argon plasma coagulator. Scattered diverticula noted the descending and sigmoid colon. Normal rectal mucosa. Single small anal papilla and hemorrhoids below the dentate line.    Therapeutic/Diagnostic Maneuvers Performed:  See above  Complications:  None  Cecal Withdrawal Time:  17 minutes  Impression:  Examination performed to cecum. 10 mm sessile polyp next appendiceal orifice. Polyp was biopsied for histology and residual polyp was ablated with argon plasma  coagulator. Left-sided diverticulosis. Mallick some hemorrhoids and single anal papilla.  Recommendations:  Standard instructions given. No aspirin or NSAIDs for 10 days. I will contact patient with biopsy results and further recommendations.  REHMAN,NAJEEB U  07/27/2014 1:45 PM  CC: Dr. Asencion Noble, MD & Dr. Rayne Du ref. provider found

## 2014-07-27 NOTE — Discharge Instructions (Signed)
No aspirin or NSAIDs for 10 days. Resume other medications and high fiber diet. No driving for 24 hours. Physician will call with biopsy results.  Colonoscopy, Care After Refer to this sheet in the next few weeks. These instructions provide you with information on caring for yourself after your procedure. Your health care provider may also give you more specific instructions. Your treatment has been planned according to current medical practices, but problems sometimes occur. Call your health care provider if you have any problems or questions after your procedure. WHAT TO EXPECT AFTER THE PROCEDURE  After your procedure, it is typical to have the following:  A small amount of blood in your stool.  Moderate amounts of gas and mild abdominal cramping or bloating. HOME CARE INSTRUCTIONS  Do not drive, operate machinery, or sign important documents for 24 hours.  You may shower and resume your regular physical activities, but move at a slower pace for the first 24 hours.  Take frequent rest periods for the first 24 hours.  Walk around or put a warm pack on your abdomen to help reduce abdominal cramping and bloating.  Drink enough fluids to keep your urine clear or pale yellow.  You may resume your normal diet as instructed by your health care provider. Avoid heavy or fried foods that are hard to digest.  Avoid drinking alcohol for 24 hours or as instructed by your health care provider.  Only take over-the-counter or prescription medicines as directed by your health care provider.  If a tissue sample (biopsy) was taken during your procedure:  Do not take aspirin or blood thinners for 7 days, or as instructed by your health care provider.  Do not drink alcohol for 7 days, or as instructed by your health care provider.  Eat soft foods for the first 24 hours. SEEK MEDICAL CARE IF: You have persistent spotting of blood in your stool 2-3 days after the procedure. SEEK IMMEDIATE MEDICAL  CARE IF:  You have more than a small spotting of blood in your stool.  You pass large blood clots in your stool.  Your abdomen is swollen (distended).  You have nausea or vomiting.  You have a fever.  You have increasing abdominal pain that is not relieved with medicine. Document Released: 05/07/2004 Document Revised: 07/14/2013 Document Reviewed: 05/31/2013 [facility] Patient Information 2015 [place], [place]. This information is not intended to replace advice given to you by your health care provider. Make sure you discuss any questions you have with your health care provider.

## 2014-08-01 ENCOUNTER — Encounter (HOSPITAL_COMMUNITY): Payer: Self-pay | Admitting: Internal Medicine

## 2014-08-02 DIAGNOSIS — E039 Hypothyroidism, unspecified: Secondary | ICD-10-CM | POA: Diagnosis not present

## 2014-08-05 ENCOUNTER — Encounter (INDEPENDENT_AMBULATORY_CARE_PROVIDER_SITE_OTHER): Payer: Self-pay | Admitting: *Deleted

## 2014-08-08 ENCOUNTER — Encounter (HOSPITAL_COMMUNITY): Payer: Self-pay | Admitting: Internal Medicine

## 2014-08-09 DIAGNOSIS — E039 Hypothyroidism, unspecified: Secondary | ICD-10-CM | POA: Diagnosis not present

## 2014-08-09 DIAGNOSIS — R5383 Other fatigue: Secondary | ICD-10-CM | POA: Diagnosis not present

## 2014-08-09 DIAGNOSIS — E3450 Androgen insensitivity syndrome, unspecified: Secondary | ICD-10-CM | POA: Diagnosis not present

## 2014-08-16 ENCOUNTER — Encounter (INDEPENDENT_AMBULATORY_CARE_PROVIDER_SITE_OTHER): Payer: Self-pay

## 2014-08-24 DIAGNOSIS — N951 Menopausal and female climacteric states: Secondary | ICD-10-CM | POA: Diagnosis not present

## 2014-08-24 DIAGNOSIS — E063 Autoimmune thyroiditis: Secondary | ICD-10-CM | POA: Diagnosis not present

## 2014-08-30 DIAGNOSIS — I493 Ventricular premature depolarization: Secondary | ICD-10-CM | POA: Diagnosis not present

## 2014-08-30 DIAGNOSIS — R000 Tachycardia, unspecified: Secondary | ICD-10-CM | POA: Diagnosis not present

## 2014-09-01 DIAGNOSIS — N951 Menopausal and female climacteric states: Secondary | ICD-10-CM | POA: Diagnosis not present

## 2014-09-01 DIAGNOSIS — E063 Autoimmune thyroiditis: Secondary | ICD-10-CM | POA: Diagnosis not present

## 2014-09-20 ENCOUNTER — Ambulatory Visit (INDEPENDENT_AMBULATORY_CARE_PROVIDER_SITE_OTHER): Payer: Medicare Other | Admitting: Cardiology

## 2014-09-20 ENCOUNTER — Encounter: Payer: Self-pay | Admitting: Cardiology

## 2014-09-20 VITALS — BP 148/82 | HR 84 | Ht 65.0 in | Wt 159.0 lb

## 2014-09-20 DIAGNOSIS — I10 Essential (primary) hypertension: Secondary | ICD-10-CM | POA: Insufficient documentation

## 2014-09-20 DIAGNOSIS — R002 Palpitations: Secondary | ICD-10-CM | POA: Insufficient documentation

## 2014-09-20 DIAGNOSIS — E039 Hypothyroidism, unspecified: Secondary | ICD-10-CM

## 2014-09-20 NOTE — Progress Notes (Signed)
Reason for visit: Palpitations  Clinical Summary Evelyn Walker is a 79 y.o.female referred for cardiology consultation by Dr. Willey Blade. She reports a history of intermittent palpitations described as a feeling of rapid heart rate, usually happens suddenly without known precipitant, and has been very brief in general, lasting only a few seconds. She did have a more intense episode about 3 weeks ago when she was in the car riding with her husband back from [place]. She states the symptoms lasted for nearly 30 minutes. She got home and checked her blood pressure and heart rate, stated that her blood pressure was normal, but that her heart rate was "125." She is not sure whether the rhythm was regular or irregular. She has no history of documented cardiac arrhythmias, specifically no atrial fibrillation. She has had PVCs documented before. She had no syncope or chest pain with these symptoms.  Recent ECG reviewed showing sinus rhythm with PVCs, nonspecific ST changes. TSH from October was 4.4. She does state that Synthroid dose has been reduced. No other changes in her medications. No excessive use of caffeine or alcohol.   No Known Allergies  Current Outpatient Prescriptions  Medication Sig Dispense Refill  . acetaminophen (TYLENOL) 500 MG tablet Take 500 mg by mouth every 6 (six) hours as needed.    Marland Kitchen aspirin 81 MG tablet Take 81 mg by mouth daily.    Marland Kitchen BIOTIN PO Take 1 tablet by mouth daily. For hair, skin, and nails    . cetirizine (ZYRTEC) 10 MG tablet Take 10 mg by mouth daily.    . Cholecalciferol (VITAMIN D-3) 5000 UNITS TABS Take 1 tablet by mouth daily.    . Coenzyme Q10 (CO Q-10) 100 MG CAPS Take by mouth.    . fluticasone (FLONASE) 50 MCG/ACT nasal spray Place 2 sprays into both nostrils daily.    Marland Kitchen KRILL OIL PO Take 300 mg by mouth.    . levothyroxine (SYNTHROID, LEVOTHROID) 125 MCG tablet Take 125 mcg by mouth daily before breakfast.    . omeprazole (PRILOSEC) 40 MG capsule Take 40 mg  by mouth daily.    Marland Kitchen PRESCRIPTION MEDICATION Take 1 application by mouth daily. [employer] compound.  Bi-est cream.    . Progesterone Micronized (PROGESTERONE PO) Take 150 mg by mouth at bedtime.     No current facility-administered medications for this visit.    Past Medical History  Diagnosis Date  . Hypothyroidism   . GERD (gastroesophageal reflux disease)   . Arthritis   . Essential hypertension   . Rhinitis     Past Surgical History  Procedure Laterality Date  . Total knee arthroplasty Right   . Colonoscopy N/A 07/27/2014    Procedure: COLONOSCOPY;  Surgeon: Rogene Houston, MD;  [facility];  Service: Endoscopy;  Laterality: N/A;  100  . Cataract extraction Bilateral     Family History  Problem Relation Age of Onset  . Stroke Mother   . Heart attack Father   . Breast cancer Paternal Grandfather   . Diabetes Paternal Grandfather   . Colon cancer Neg Hx     Social History Ms. Wurster reports that she quit smoking about 28 years ago. Her smoking use included Cigarettes. She has a 2.5 pack-year smoking history. She has never used smokeless tobacco. Ms. Hascall reports that she drinks alcohol.  Review of Systems Complete review of systems negative except as otherwise outlined in the clinical summary and also the following. Reports somewhat more fatigued over  the years and shortness of breath, but no major limitations with typical ADLs. No exertional chest pain. No orthopnea or PND. No leg edema.  Physical Examination Filed Vitals:   09/20/14 0831  BP: 148/82  Pulse: 84   Filed Weights   09/20/14 0831  Weight: 159 lb (72.122 kg)   Patient appears comfortable at rest. HEENT: Conjunctiva and lids normal, oropharynx clear with moist mucosa. Neck: Supple, no elevated JVP or carotid bruits, no thyromegaly. Lungs: Clear to auscultation, nonlabored breathing at rest. Cardiac: Regular rate and rhythm occasional ectopy, S4, soft systolic murmur, no  pericardial rub. Abdomen: Soft, nontender, bowel sounds present, no guarding or rebound. Extremities: No pitting edema, distal pulses 2+. Skin: Warm and dry. Musculoskeletal: No kyphosis. Neuropsychiatric: Alert and oriented x3, affect grossly appropriate.   Problem List and Plan   Palpitations Etiology not certain based on description. She has had no chest pain or syncope with these symptoms. Baseline ECG reviewed, PVCs documented, but otherwise no known history of cardiac arrhythmias. We will obtain a cardiac event monitor for further evaluation, also echocardiogram to assess cardiac structure and function. Office follow-up arranged in one month.  Hypothyroidism On Synthroid, normal TSH in October. Followed by Dr. Willey Blade.  Essential hypertension Followed by Dr. Willey Blade.    [name], M.D., F.A.C.C.

## 2014-09-20 NOTE — Assessment & Plan Note (Signed)
Followed by Dr. Willey Blade.

## 2014-09-20 NOTE — Patient Instructions (Signed)
Your physician recommends that you schedule a follow-up appointment in: 1 month with Dr. Domenic Polite  Your physician has requested that you have an echocardiogram. Echocardiography is a painless test that uses sound waves to create images of your heart. It provides your doctor with information about the size and shape of your heart and how well your heart's chambers and valves are working. This procedure takes approximately one hour. There are no restrictions for this procedure.  Your physician has recommended that you wear an event monitor for 30 days. Event monitors are medical devices that record the heart's electrical activity. Doctors most often [place] these monitors to diagnose arrhythmias. Arrhythmias are problems with the speed or rhythm of the heartbeat. The monitor is a small, portable device. You can wear one while you do your normal daily activities. This is usually used to diagnose what is causing palpitations/syncope (passing out).  Your physician recommends that you continue on your current medications as directed. Please refer to the Current Medication list given to you today.  Thank you for choosing [facility]!!

## 2014-09-20 NOTE — Assessment & Plan Note (Signed)
On Synthroid, normal TSH in October. Followed by Dr. Willey Blade.

## 2014-09-20 NOTE — Assessment & Plan Note (Signed)
Etiology not certain based on description. She has had no chest pain or syncope with these symptoms. Baseline ECG reviewed, PVCs documented, but otherwise no known history of cardiac arrhythmias. We will obtain a cardiac event monitor for further evaluation, also echocardiogram to assess cardiac structure and function. Office follow-up arranged in one month.

## 2014-09-21 DIAGNOSIS — R002 Palpitations: Secondary | ICD-10-CM | POA: Diagnosis not present

## 2014-09-26 ENCOUNTER — Ambulatory Visit (HOSPITAL_COMMUNITY)
Admission: RE | Admit: 2014-09-26 | Discharge: 2014-09-26 | Disposition: A | Discharge status: Home / Self Care | Payer: Medicare Other | Source: Ambulatory Visit | Attending: Cardiology | Admitting: Cardiology

## 2014-09-26 DIAGNOSIS — I10 Essential (primary) hypertension: Secondary | ICD-10-CM | POA: Insufficient documentation

## 2014-09-26 DIAGNOSIS — Z87891 Personal history of nicotine dependence: Secondary | ICD-10-CM | POA: Diagnosis not present

## 2014-09-26 DIAGNOSIS — I059 Rheumatic mitral valve disease, unspecified: Secondary | ICD-10-CM | POA: Diagnosis not present

## 2014-09-26 DIAGNOSIS — I351 Nonrheumatic aortic (valve) insufficiency: Secondary | ICD-10-CM | POA: Diagnosis not present

## 2014-09-26 DIAGNOSIS — R002 Palpitations: Secondary | ICD-10-CM | POA: Insufficient documentation

## 2014-09-26 DIAGNOSIS — I340 Nonrheumatic mitral (valve) insufficiency: Secondary | ICD-10-CM | POA: Insufficient documentation

## 2014-09-26 NOTE — Progress Notes (Signed)
  Echocardiogram 2D Echocardiogram has been performed.  [place], [place] 09/26/2014, 1:26 PM

## 2014-10-10 ENCOUNTER — Other Ambulatory Visit (HOSPITAL_COMMUNITY): Payer: Self-pay | Admitting: Internal Medicine

## 2014-10-10 DIAGNOSIS — Z1231 Encounter for screening mammogram for malignant neoplasm of breast: Secondary | ICD-10-CM

## 2014-10-24 ENCOUNTER — Encounter: Payer: Self-pay | Admitting: Cardiology

## 2014-10-24 ENCOUNTER — Ambulatory Visit (INDEPENDENT_AMBULATORY_CARE_PROVIDER_SITE_OTHER): Payer: Medicare Other | Admitting: Cardiology

## 2014-10-24 VITALS — BP 140/80 | HR 66 | Ht 65.0 in | Wt 160.2 lb

## 2014-10-24 DIAGNOSIS — I10 Essential (primary) hypertension: Secondary | ICD-10-CM | POA: Diagnosis not present

## 2014-10-24 DIAGNOSIS — R002 Palpitations: Secondary | ICD-10-CM | POA: Diagnosis not present

## 2014-10-24 NOTE — Assessment & Plan Note (Signed)
Occasional PVCs noted by cardiac monitoring in the setting of normal LVEF and no major valvular abnormalities by echocardiogram. Would recommend observation and exercise at this point. No clear indication for medication treatment. Keep follow-up with Dr. Willey Blade. We can see her back as needed.

## 2014-10-24 NOTE — Patient Instructions (Signed)
Your physician recommends that you schedule a follow-up appointment only if needed      Thank you for choosing [facility] !         

## 2014-10-24 NOTE — Progress Notes (Signed)
Reason for visit: Palpitations  Clinical Summary Evelyn Walker is a 80 y.o.female seen recently in December 2015. Testing was arranged at that time for further evaluation of palpitations. She reports no change in symptoms, no chest pain, dizziness, or syncope. She did wear her cardiac monitor, although has had some skin irritation at the lead sites. She is putting steroid cream on this presently.  Echocardiogram from December 2015 reported LVEF 60-65% with normal diastolic function, trivial aortic regurgitation, mild mitral regurgitation, and PASP 47 mmHg. Outpatient cardiac monitor showed sinus rhythm with occasional PVCs.  We reviewed the results of her testing, overall fairly reassuring. At this point would recommend observation and exercise.   No Known Allergies  Current Outpatient Prescriptions  Medication Sig Dispense Refill  . acetaminophen (TYLENOL) 500 MG tablet Take 500 mg by mouth every 6 (six) hours as needed.    Marland Kitchen aspirin 81 MG tablet Take 81 mg by mouth daily.    Marland Kitchen BIOTIN PO Take 1 tablet by mouth daily. For hair, skin, and nails    . cetirizine (ZYRTEC) 10 MG tablet Take 10 mg by mouth daily.    . Cholecalciferol (VITAMIN D-3) 5000 UNITS TABS Take 1 tablet by mouth daily.    . Coenzyme Q10 (CO Q-10) 100 MG CAPS Take by mouth.    . fluticasone (FLONASE) 50 MCG/ACT nasal spray Place 2 sprays into both nostrils daily.    Marland Kitchen KRILL OIL PO Take 300 mg by mouth.    . levothyroxine (SYNTHROID, LEVOTHROID) 125 MCG tablet Take 125 mcg by mouth daily before breakfast.    . omeprazole (PRILOSEC) 40 MG capsule Take 40 mg by mouth daily.    Marland Kitchen PRESCRIPTION MEDICATION Take 1 application by mouth daily. [employer] compound.  Bi-est cream.    . Progesterone Micronized (PROGESTERONE PO) Take 150 mg by mouth at bedtime.     No current facility-administered medications for this visit.    Past Medical History  Diagnosis Date  . Hypothyroidism   . GERD (gastroesophageal reflux  disease)   . Arthritis   . Essential hypertension   . Rhinitis     Social History Ms. Evelyn Walker reports that she quit smoking about 28 years ago. Her smoking use included Cigarettes. She started smoking about 57 years ago. She has a 2.5 pack-year smoking history. She has never used smokeless tobacco. Ms. Evelyn Walker reports that she drinks alcohol.  Review of Systems Complete review of systems negative except as otherwise outlined in the clinical summary and also the following. No orthopnea or PND.  Physical Examination Filed Vitals:   10/24/14 1259  BP: 140/80  Pulse: 66   Filed Weights   10/24/14 1259  Weight: 160 lb 3.2 oz (72.666 kg)    Patient appears comfortable at rest. HEENT: Conjunctiva and lids normal, oropharynx clear with moist mucosa. Neck: Supple, no elevated JVP or carotid bruits, no thyromegaly. Lungs: Clear to auscultation, nonlabored breathing at rest. Cardiac: Regular rate and rhythm occasional ectopy, S4, soft systolic murmur, no pericardial rub. Abdomen: Soft, nontender, bowel sounds present, no guarding or rebound. Extremities: No pitting edema, distal pulses 2+. Skin: Warm and dry. Patchy erythema in regions of monitor lead placement.   Problem List and Plan   Palpitations Occasional PVCs noted by cardiac monitoring in the setting of normal LVEF and no major valvular abnormalities by echocardiogram. Would recommend observation and exercise at this point. No clear indication for medication treatment. Keep follow-up with Dr. Willey Blade. We can see her back  as needed.   Essential hypertension Blood pressure mildly elevated today. Not on specific medical therapy. Keep follow with Dr. Willey Blade.     Evelyn Walker, M.D., F.A.C.C.

## 2014-10-24 NOTE — Assessment & Plan Note (Signed)
Blood pressure mildly elevated today. Not on specific medical therapy. Keep follow with Dr. Willey Blade.

## 2014-10-27 ENCOUNTER — Other Ambulatory Visit: Payer: Self-pay | Admitting: *Deleted

## 2014-10-27 DIAGNOSIS — R002 Palpitations: Secondary | ICD-10-CM

## 2014-11-02 ENCOUNTER — Ambulatory Visit (HOSPITAL_COMMUNITY): Payer: Medicare Other

## 2014-11-16 ENCOUNTER — Ambulatory Visit (HOSPITAL_COMMUNITY)
Admission: RE | Admit: 2014-11-16 | Discharge: 2014-11-16 | Disposition: A | Discharge status: Home / Self Care | Payer: Medicare Other | Source: Ambulatory Visit | Attending: Internal Medicine | Admitting: Internal Medicine

## 2014-11-16 DIAGNOSIS — Z1231 Encounter for screening mammogram for malignant neoplasm of breast: Secondary | ICD-10-CM | POA: Diagnosis not present

## 2014-12-06 DIAGNOSIS — Z1389 Encounter for screening for other disorder: Secondary | ICD-10-CM | POA: Diagnosis not present

## 2014-12-06 DIAGNOSIS — N951 Menopausal and female climacteric states: Secondary | ICD-10-CM | POA: Diagnosis not present

## 2015-01-31 DIAGNOSIS — M25562 Pain in left knee: Secondary | ICD-10-CM | POA: Diagnosis not present

## 2015-01-31 DIAGNOSIS — N951 Menopausal and female climacteric states: Secondary | ICD-10-CM | POA: Diagnosis not present

## 2015-02-07 DIAGNOSIS — M1712 Unilateral primary osteoarthritis, left knee: Secondary | ICD-10-CM | POA: Diagnosis not present

## 2015-03-28 DIAGNOSIS — N951 Menopausal and female climacteric states: Secondary | ICD-10-CM | POA: Diagnosis not present

## 2015-03-28 DIAGNOSIS — M545 Low back pain: Secondary | ICD-10-CM | POA: Diagnosis not present

## 2015-03-28 DIAGNOSIS — Z6827 Body mass index (BMI) 27.0-27.9, adult: Secondary | ICD-10-CM | POA: Diagnosis not present

## 2015-04-17 DIAGNOSIS — E039 Hypothyroidism, unspecified: Secondary | ICD-10-CM | POA: Diagnosis not present

## 2015-04-17 DIAGNOSIS — K219 Gastro-esophageal reflux disease without esophagitis: Secondary | ICD-10-CM | POA: Diagnosis not present

## 2015-04-17 DIAGNOSIS — M1990 Unspecified osteoarthritis, unspecified site: Secondary | ICD-10-CM | POA: Diagnosis not present

## 2015-04-17 DIAGNOSIS — I10 Essential (primary) hypertension: Secondary | ICD-10-CM | POA: Diagnosis not present

## 2015-04-17 DIAGNOSIS — Z79899 Other long term (current) drug therapy: Secondary | ICD-10-CM | POA: Diagnosis not present

## 2015-04-25 DIAGNOSIS — E039 Hypothyroidism, unspecified: Secondary | ICD-10-CM | POA: Diagnosis not present

## 2015-04-25 DIAGNOSIS — M1990 Unspecified osteoarthritis, unspecified site: Secondary | ICD-10-CM | POA: Diagnosis not present

## 2015-04-25 DIAGNOSIS — E785 Hyperlipidemia, unspecified: Secondary | ICD-10-CM | POA: Diagnosis not present

## 2015-04-25 DIAGNOSIS — Z6827 Body mass index (BMI) 27.0-27.9, adult: Secondary | ICD-10-CM | POA: Diagnosis not present

## 2015-05-10 ENCOUNTER — Other Ambulatory Visit (HOSPITAL_COMMUNITY): Payer: Self-pay | Admitting: Internal Medicine

## 2015-05-10 DIAGNOSIS — M1990 Unspecified osteoarthritis, unspecified site: Secondary | ICD-10-CM

## 2015-05-12 ENCOUNTER — Other Ambulatory Visit (HOSPITAL_COMMUNITY): Payer: Self-pay | Admitting: Internal Medicine

## 2015-05-12 DIAGNOSIS — M1990 Unspecified osteoarthritis, unspecified site: Secondary | ICD-10-CM

## 2015-05-12 DIAGNOSIS — Z780 Asymptomatic menopausal state: Secondary | ICD-10-CM

## 2015-05-16 ENCOUNTER — Other Ambulatory Visit (HOSPITAL_COMMUNITY): Payer: Self-pay | Admitting: Internal Medicine

## 2015-05-16 DIAGNOSIS — Z780 Asymptomatic menopausal state: Secondary | ICD-10-CM

## 2015-05-16 DIAGNOSIS — M1712 Unilateral primary osteoarthritis, left knee: Secondary | ICD-10-CM | POA: Diagnosis not present

## 2015-05-16 DIAGNOSIS — M1990 Unspecified osteoarthritis, unspecified site: Secondary | ICD-10-CM

## 2015-05-16 DIAGNOSIS — M25561 Pain in right knee: Secondary | ICD-10-CM | POA: Diagnosis not present

## 2015-05-17 ENCOUNTER — Ambulatory Visit (HOSPITAL_COMMUNITY)
Admission: RE | Admit: 2015-05-17 | Discharge: 2015-05-17 | Disposition: A | Discharge status: Home / Self Care | Payer: Medicare Other | Source: Ambulatory Visit | Attending: Internal Medicine | Admitting: Internal Medicine

## 2015-05-17 DIAGNOSIS — Z780 Asymptomatic menopausal state: Secondary | ICD-10-CM | POA: Insufficient documentation

## 2015-05-17 DIAGNOSIS — M1990 Unspecified osteoarthritis, unspecified site: Secondary | ICD-10-CM

## 2015-05-17 DIAGNOSIS — M85852 Other specified disorders of bone density and structure, left thigh: Secondary | ICD-10-CM | POA: Diagnosis not present

## 2015-05-17 DIAGNOSIS — M8588 Other specified disorders of bone density and structure, other site: Secondary | ICD-10-CM | POA: Insufficient documentation

## 2015-05-17 DIAGNOSIS — M8580 Other specified disorders of bone density and structure, unspecified site: Secondary | ICD-10-CM | POA: Diagnosis present

## 2015-07-11 DIAGNOSIS — M25562 Pain in left knee: Secondary | ICD-10-CM | POA: Diagnosis not present

## 2015-07-11 DIAGNOSIS — N951 Menopausal and female climacteric states: Secondary | ICD-10-CM | POA: Diagnosis not present

## 2015-07-17 DIAGNOSIS — E039 Hypothyroidism, unspecified: Secondary | ICD-10-CM | POA: Diagnosis not present

## 2015-07-19 ENCOUNTER — Encounter (HOSPITAL_COMMUNITY): Payer: Self-pay | Admitting: Physician Assistant

## 2015-07-19 DIAGNOSIS — M1712 Unilateral primary osteoarthritis, left knee: Secondary | ICD-10-CM | POA: Diagnosis not present

## 2015-07-19 NOTE — H&P (Signed)
TOTAL KNEE ADMISSION H&P  Patient is being admitted for left total knee arthroplasty.  Subjective:  Chief Complaint:left knee pain.  HPI: Evelyn Walker, 80 y.o. female, has a history of pain and functional disability in the left knee due to arthritis and has failed non-surgical conservative treatments for greater than 12 weeks to includeNSAID's and/or analgesics, corticosteriod injections, viscosupplementation injections, flexibility and strengthening excercises, supervised PT with diminished ADL's post treatment, weight reduction as appropriate and activity modification.  Onset of symptoms was gradual, starting 10 years ago with gradually worsening course since that time. The patient noted prior procedures on the knee to include  arthroscopy and menisectomy on the left knee(s).  Patient currently rates pain in the left knee(s) at 10 out of 10 with activity. Patient has night pain, worsening of pain with activity and weight bearing, pain that interferes with activities of daily living, crepitus and joint swelling.  Patient has evidence of subchondral sclerosis, periarticular osteophytes and joint space narrowing by imaging studies.There is no active infection.  Patient Active Problem List   Diagnosis Date Noted  . Primary localized osteoarthritis of left knee   . Palpitations 09/20/2014  . Essential hypertension 09/20/2014  . Hypothyroidism 02/24/2013   Past Medical History  Diagnosis Date  . Hypothyroidism   . GERD (gastroesophageal reflux disease)   . Arthritis   . Essential hypertension   . Rhinitis   . Primary localized osteoarthritis of left knee     Past Surgical History  Procedure Laterality Date  . Total knee arthroplasty Right 2004  . Colonoscopy N/A 07/27/2014    Procedure: COLONOSCOPY;  Surgeon: Rogene Houston, MD;  Location[facility];  Service: Endoscopy;  Laterality: N/A;  100  . Cataract extraction Bilateral     No current facility-administered medications for this  encounter.  Current outpatient prescriptions:  .  amoxicillin (AMOXIL) 500 MG capsule, Take 500 mg by mouth See admin instructions. Take 4 capsules (2000 mg) by mouth one hour prior to dental appointments, Disp: , Rfl:  .  cetirizine (ZYRTEC) 10 MG tablet, Take 10 mg by mouth daily., Disp: , Rfl:  .  Cholecalciferol (VITAMIN D-3) 5000 UNITS TABS, Take 5,000 Units by mouth daily. , Disp: , Rfl:  .  Coenzyme Q10 (CO Q-10) 100 MG CAPS, Take 100 mg by mouth daily. , Disp: , Rfl:  .  fluticasone (FLONASE) 50 MCG/ACT nasal spray, Place 2 sprays into both nostrils at bedtime. , Disp: , Rfl:  .  Glucosamine 500 MG CAPS, Take 500 mg by mouth daily., Disp: , Rfl:  .  Inulin (FIBER CHOICE PO), Take 1 tablet by mouth daily., Disp: , Rfl:  .  levothyroxine (SYNTHROID, LEVOTHROID) 137 MCG tablet, Take 137 mcg by mouth daily before breakfast., Disp: , Rfl:  .  MAGNESIUM PO, Take 1 tablet by mouth at bedtime., Disp: , Rfl:  .  MEGARED OMEGA-3 KRILL OIL PO, Take 350 mg by mouth daily., Disp: , Rfl:  .  Multiple Vitamins-Minerals (HAIR/SKIN/NAILS/BIOTIN) TABS, Take 2 tablets by mouth daily., Disp: , Rfl:  .  Multiple Vitamins-Minerals (ICAPS) CAPS, Take 1 capsule by mouth daily., Disp: , Rfl:  .  naproxen sodium (ALEVE) 220 MG tablet, Take 220 mg by mouth 2 (two) times daily., Disp: , Rfl:  .  omeprazole (PRILOSEC) 40 MG capsule, Take 40 mg by mouth daily before breakfast. , Disp: , Rfl:  .  OVER THE COUNTER MEDICATION, Take 1 capsule by mouth See admin instructions. DIM - Plus (  Diindolylmethane) - Take 1 capsule by mouth every morning EXCEPT on Sundays, Disp: , Rfl:  .  Polyethyl Glycol-Propyl Glycol (SYSTANE ULTRA OP), Place 1 drop into both eyes at bedtime., Disp: , Rfl:  .  PRESCRIPTION MEDICATION, Apply 1 application topically See admin instructions. Progesterone cream - 3% - compounded at [facility] - apply 1 pump topically at bedtime EXCEPT on Sundays, Disp: , Rfl:  .  PRESCRIPTION MEDICATION,  Apply 2 application topically See admin instructions. Bi-Est 2.5 mg/80-20 cream compounded at [facility] - apply 2 pumps topically every morning EXCEPT on Sundays, Disp: , Rfl:  .  PRESCRIPTION MEDICATION, Take 150 mg by mouth See admin instructions. Progesterone capsules compounded at [facility] - take 1 capsule (150 mg) by mouth daily EXCEPT on Sundays, Disp: , Rfl:  .  Probiotic Product (DIGESTIVE ADVANTAGE GUMMIES) CHEW, Chew 1 tablet by mouth daily., Disp: , Rfl:  No Known Allergies  Social History  Substance Use Topics  . Smoking status: Former Smoker -- 0.25 packs/day for 10 years    Types: Cigarettes    Start date: 10/07/1957    Quit date: 09/20/1986  . Smokeless tobacco: Never Used  . Alcohol Use: 0.0 oz/week    0 Standard drinks or equivalent per week     Comment: Wine once in a while    Family History  Problem Relation Age of Onset  . Stroke Mother   . Heart attack Father   . Breast cancer Paternal Grandfather   . Diabetes Paternal Grandfather   . Colon cancer Neg Hx      Review of Systems  Constitutional: Negative.   HENT: Negative.   Eyes: Negative.   Respiratory: Negative.   Cardiovascular: Negative.   Gastrointestinal: Negative.   Genitourinary: Negative.   Musculoskeletal: Positive for back pain and joint pain.  Skin: Negative.   Neurological: Negative.   Endo/Heme/Allergies: Negative.   Psychiatric/Behavioral: Negative.     Objective:  Physical Exam  Constitutional: She is oriented to person, place, and time. She appears well-developed and well-nourished.  HENT:  Head: Normocephalic and atraumatic.  Mouth/Throat: Oropharynx is clear and moist.  Eyes: Conjunctivae are normal. Pupils are equal, round, and reactive to light.  Neck: Neck supple.  Cardiovascular: Normal rate.   Irregular rhythm  Respiratory: Effort normal and breath sounds normal.  GI: Bowel sounds are normal.  Genitourinary:  Not pertinent to current symptomatology  therefore not examined.  Musculoskeletal:  Examination of her left knee reveals pain medial and laterally. Moderate valgus deformity.  There is 1+ synovitis. Range of motion is from 0-120 degrees. The knee is stable with normal patellar tracking. Examination of her right knee reveals a full range of motion with a well healed total knee incision, no swelling or pain. Vascular exam: pulses 2+ and symmetric.  Neurologic exam, distal motor and sensory examination within normal limits. Skin exam reveals no skin lesions in the lower extremity.   Neurological: She is alert and oriented to person, place, and time.  Skin: Skin is warm and dry.  Psychiatric: She has a normal mood and affect. Her behavior is normal.    Vital signs in last 24 hours: Temp:  [98.6 F (37 C)] 98.6 F (37 C) (10/12 1500) Pulse Rate:  [87] 87 (10/12 1500) BP: (127)/(86) 127/86 mmHg (10/12 1500) SpO2:  [97 %] 97 % (10/12 1500) Weight:  [71.668 kg (158 lb)] 71.668 kg (158 lb) (10/12 1500)  Labs:   Estimated body mass index is 27.11 kg/(m^2) as  calculated from the following:   Height as of this encounter: 5\' 4"  (1.626 m).   Weight as of this encounter: 71.668 kg (158 lb).   Imaging Review Plain radiographs demonstrate severe degenerative joint disease of the left knee(s). The overall alignment issignificant valgus. The bone quality appears to be good for age and reported activity level.  Assessment/Plan:  End stage arthritis, left knee   The patient history, physical examination, clinical judgment of the provider and imaging studies are consistent with end stage degenerative joint disease of the left knee(s) and total knee arthroplasty is deemed medically necessary. The treatment options including medical management, injection therapy arthroscopy and arthroplasty were discussed at length. The risks and benefits of total knee arthroplasty were presented and reviewed. The risks due to aseptic loosening, infection,  stiffness, patella tracking problems, thromboembolic complications and other imponderables were discussed. The patient acknowledged the explanation, agreed to proceed with the plan and consent was signed. Patient is being admitted for inpatient treatment for surgery, pain control, PT, OT, prophylactic antibiotics, VTE prophylaxis, progressive ambulation and ADL's and discharge planning. The patient is planning to be discharged to skilled nursing facility [facility]

## 2015-07-20 ENCOUNTER — Encounter (HOSPITAL_COMMUNITY): Payer: Self-pay

## 2015-07-20 ENCOUNTER — Encounter (HOSPITAL_COMMUNITY)
Admission: RE | Admit: 2015-07-20 | Discharge: 2015-07-20 | Disposition: A | Discharge status: Home / Self Care | Payer: Medicare Other | Source: Ambulatory Visit | Attending: Orthopedic Surgery | Admitting: Orthopedic Surgery

## 2015-07-20 DIAGNOSIS — R9431 Abnormal electrocardiogram [ECG] [EKG]: Secondary | ICD-10-CM | POA: Insufficient documentation

## 2015-07-20 DIAGNOSIS — M1712 Unilateral primary osteoarthritis, left knee: Secondary | ICD-10-CM | POA: Diagnosis not present

## 2015-07-20 DIAGNOSIS — E039 Hypothyroidism, unspecified: Secondary | ICD-10-CM | POA: Insufficient documentation

## 2015-07-20 DIAGNOSIS — Z01818 Encounter for other preprocedural examination: Secondary | ICD-10-CM | POA: Insufficient documentation

## 2015-07-20 DIAGNOSIS — Z0183 Encounter for blood typing: Secondary | ICD-10-CM | POA: Insufficient documentation

## 2015-07-20 DIAGNOSIS — K219 Gastro-esophageal reflux disease without esophagitis: Secondary | ICD-10-CM | POA: Diagnosis not present

## 2015-07-20 DIAGNOSIS — I10 Essential (primary) hypertension: Secondary | ICD-10-CM | POA: Insufficient documentation

## 2015-07-20 DIAGNOSIS — Z01812 Encounter for preprocedural laboratory examination: Secondary | ICD-10-CM | POA: Diagnosis not present

## 2015-07-20 DIAGNOSIS — Z87891 Personal history of nicotine dependence: Secondary | ICD-10-CM | POA: Insufficient documentation

## 2015-07-20 DIAGNOSIS — Z96651 Presence of right artificial knee joint: Secondary | ICD-10-CM | POA: Insufficient documentation

## 2015-07-20 DIAGNOSIS — Z79899 Other long term (current) drug therapy: Secondary | ICD-10-CM | POA: Insufficient documentation

## 2015-07-20 HISTORY — DX: Other specified postprocedural states: Z98.890

## 2015-07-20 HISTORY — DX: Other specified postprocedural states: R11.2

## 2015-07-20 LAB — COMPREHENSIVE METABOLIC PANEL
ALBUMIN: 4.5 g/dL (ref 3.5–5.0)
ALK PHOS: 78 U/L (ref 38–126)
ALT: 17 U/L (ref 14–54)
AST: 26 U/L (ref 15–41)
Anion gap: 11 (ref 5–15)
BUN: 19 mg/dL (ref 6–20)
CALCIUM: 9.5 mg/dL (ref 8.9–10.3)
CHLORIDE: 103 mmol/L (ref 101–111)
CO2: 24 mmol/L (ref 22–32)
CREATININE: 0.81 mg/dL (ref 0.44–1.00)
GFR calc Af Amer: >60 mL/min (ref >60)
GFR calc non Af Amer: >60 mL/min (ref >60)
GLUCOSE: 108 mg/dL — AB (ref 65–99)
Potassium: 4.3 mmol/L (ref 3.5–5.1)
SODIUM: 138 mmol/L (ref 135–145)
Total Bilirubin: 0.7 mg/dL (ref 0.3–1.2)
Total Protein: 7.6 g/dL (ref 6.5–8.1)

## 2015-07-20 LAB — CBC WITH DIFFERENTIAL/PLATELET
BASOS ABS: 0.2 10*3/uL — AB (ref 0.0–0.1)
BASOS PCT: 2 %
EOS ABS: 0.9 10*3/uL — AB (ref 0.0–0.7)
Eosinophils Relative: 9 %
HCT: 41.4 % (ref 36.0–46.0)
HEMOGLOBIN: 13.5 g/dL (ref 12.0–15.0)
LYMPHS ABS: 1.9 10*3/uL (ref 0.7–4.0)
Lymphocytes Relative: 20 %
MCH: 31 pg (ref 26.0–34.0)
MCHC: 32.6 g/dL (ref 30.0–36.0)
MCV: 95 fL (ref 78.0–100.0)
Monocytes Absolute: 1.2 10*3/uL — ABNORMAL HIGH (ref 0.1–1.0)
Monocytes Relative: 12 %
NEUTROS PCT: 57 %
Neutro Abs: 5.3 10*3/uL (ref 1.7–7.7)
PLATELETS: 277 10*3/uL (ref 150–400)
RBC: 4.36 MIL/uL (ref 3.87–5.11)
RDW: 14.4 % (ref 11.5–15.5)
WBC: 9.4 10*3/uL (ref 4.0–10.5)

## 2015-07-20 LAB — PROTIME-INR
INR: 1.11 (ref 0.00–1.49)
Prothrombin Time: 14.5 seconds (ref 11.6–15.2)

## 2015-07-20 LAB — SURGICAL PCR SCREEN
MRSA, PCR: NEGATIVE
Staphylococcus aureus: POSITIVE — AB

## 2015-07-20 LAB — ABO/RH: ABO/RH(D): O POS

## 2015-07-20 LAB — TYPE AND SCREEN
ABO/RH(D): O POS
Antibody Screen: NEGATIVE

## 2015-07-20 LAB — APTT: APTT: 26 s (ref 24–37)

## 2015-07-20 MED ORDER — CHLORHEXIDINE GLUCONATE 4 % EX LIQD: 60.0000 mL | Freq: Once | CUTANEOUS | Status: DC

## 2015-07-20 NOTE — Progress Notes (Signed)
Mupirocin Ointment Rx called into [facility] for positive PCR of Staph. Left message on pt's home answering machine informing her of results and need to pick up Rx.

## 2015-07-20 NOTE — Pre-Procedure Instructions (Signed)
    EOLA WALDREP  07/20/2015      [place], [place] [address] [place] [address] Phone: [phone] Fax: [phone]  [place], [place] [address] Phone: [phone] Fax: [phone]    Your procedure is scheduled on July 31, 2015.  Report to [facility] Admitting at 8:45 A.M.  Call this number if you have problems the morning of surgery:  [phone]   Remember:  Do not eat food or drink liquids after midnight.  Take these medicines the morning of surgery with A SIP OF WATER :cetirizine (ZYRTEC) , levothyroxine (SYNTHROID, LEVOTHROID), omeprazole (PRILOSEC)   STOP NSAIDS (ALEVE, ADVIL, IBUPROFEN), KRILL OIL, HERBAL MEDICATIONS ONE WEEK PRIOR TO SURGERY   Do not wear jewelry, make-up or nail polish.  Do not wear lotions, powders, or perfumes.  You may wear deodorant.  Do not shave 48 hours prior to surgery.    Do not bring valuables to the hospital.  [facility] is not responsible for any belongings or valuables.  Contacts, dentures or bridgework may not be worn into surgery.  Leave your suitcase in the car.  After surgery it may be brought to your room.  For patients admitted to the hospital, discharge time will be determined by your treatment team.     Name and phone number of your driver:    Special instructions:  "Preparing for surgery"  Please read over the following fact sheets that you were given. Pain Booklet, Coughing and Deep Breathing, Blood Transfusion Information, Total Joint Packet and Surgical Site Infection Prevention

## 2015-07-21 LAB — URINE CULTURE: Culture: 6000

## 2015-07-21 NOTE — Progress Notes (Signed)
Anesthesia Chart Review: Patient is a 80 year old female scheduled for left TKR on 07/31/15 by Dr. Noemi Chapel.  History includes former smoker, post-operative N/V, hypothyroidism, GERD, HTN, osteoarthritis, right TKA '04. PCP is listed as Dr. Asencion Noble. She was seen earlier this year by cardiologist Dr. Domenic Polite for palpitation with occasional PVCs and no major valve abnormalities. He recommended, "Would recommend observation and exercise at this point. No clear indication for medication treatment. Keep follow-up with Dr. Willey Blade. We can see her back as needed."  Medications include amoxicillin, Zyrtec, Flonase, levothyroxine, magnesium, krill oil, Prilosec.  07/20/15 EKG: NSR, possible anterior infarct (age undetermined).  09/2014 30 day Holter monitor: SR with PVCs(5).  09/26/14 Echo: Study Conclusions - Left ventricle: The cavity size was normal. Wall thickness was normal. Systolic function was normal. The estimated ejection fraction was in the range of 60% to 65%. Wall motion was normal; there were no regional wall motion abnormalities. Leftventricular diastolic function parameters were normal. - Aortic valve: There was trivial regurgitation. - Mitral valve: There was mild regurgitation. - Atrial septum: No defect or patent foramen ovale was identified. - Pulmonary arteries: Systolic pressure was moderately increased. PA peak pressure: 47 mm Hg (S). - Technically adequate study.  Preoperative labs noted. Urine culture showed insignificant growth.  If no acute changes then I anticipate that she can proceed as planned.  George Hugh [facility] Short Stay Center/Anesthesiology Phone [phone] 07/21/2015 4:00 PM

## 2015-07-24 DIAGNOSIS — E039 Hypothyroidism, unspecified: Secondary | ICD-10-CM | POA: Diagnosis not present

## 2015-07-24 DIAGNOSIS — M1990 Unspecified osteoarthritis, unspecified site: Secondary | ICD-10-CM | POA: Diagnosis not present

## 2015-07-24 DIAGNOSIS — Z6827 Body mass index (BMI) 27.0-27.9, adult: Secondary | ICD-10-CM | POA: Diagnosis not present

## 2015-07-25 DIAGNOSIS — Z23 Encounter for immunization: Secondary | ICD-10-CM | POA: Diagnosis not present

## 2015-07-30 MED ORDER — POVIDONE-IODINE 7.5 % EX SOLN
Freq: Once | CUTANEOUS | Status: DC
  Filled 2015-07-30: qty 118

## 2015-07-30 MED ORDER — CEFAZOLIN SODIUM-DEXTROSE 2-3 GM-% IV SOLR
2.0000 g | INTRAVENOUS | Status: AC
  Administered 2015-07-31: 2 g via INTRAVENOUS
  Filled 2015-07-30: qty 50

## 2015-07-30 MED ORDER — LACTATED RINGERS IV SOLN
INTRAVENOUS | Status: DC
  Administered 2015-07-31 (×2): via INTRAVENOUS

## 2015-07-31 ENCOUNTER — Encounter (HOSPITAL_COMMUNITY): Payer: Self-pay | Admitting: Certified Registered"

## 2015-07-31 ENCOUNTER — Encounter (HOSPITAL_COMMUNITY)
Admission: RE | Disposition: A | Discharge status: Home Health | Payer: Self-pay | Source: Ambulatory Visit | Attending: Orthopedic Surgery

## 2015-07-31 ENCOUNTER — Inpatient Hospital Stay (HOSPITAL_COMMUNITY)
Admission: RE | Admit: 2015-07-31 | Discharge: 2015-08-03 | DRG: 470 | Disposition: A | Discharge status: Home Health | Payer: Medicare Other | Source: Ambulatory Visit | Attending: Orthopedic Surgery | Admitting: Orthopedic Surgery

## 2015-07-31 ENCOUNTER — Inpatient Hospital Stay (HOSPITAL_COMMUNITY): Payer: Medicare Other | Admitting: Certified Registered"

## 2015-07-31 ENCOUNTER — Inpatient Hospital Stay (HOSPITAL_COMMUNITY): Payer: Medicare Other | Admitting: Vascular Surgery

## 2015-07-31 DIAGNOSIS — M1710 Unilateral primary osteoarthritis, unspecified knee: Secondary | ICD-10-CM | POA: Diagnosis present

## 2015-07-31 DIAGNOSIS — M179 Osteoarthritis of knee, unspecified: Secondary | ICD-10-CM | POA: Diagnosis present

## 2015-07-31 DIAGNOSIS — Z87891 Personal history of nicotine dependence: Secondary | ICD-10-CM | POA: Diagnosis not present

## 2015-07-31 DIAGNOSIS — E039 Hypothyroidism, unspecified: Secondary | ICD-10-CM | POA: Diagnosis present

## 2015-07-31 DIAGNOSIS — I10 Essential (primary) hypertension: Secondary | ICD-10-CM | POA: Diagnosis present

## 2015-07-31 DIAGNOSIS — Z96651 Presence of right artificial knee joint: Secondary | ICD-10-CM | POA: Diagnosis present

## 2015-07-31 DIAGNOSIS — M25562 Pain in left knee: Secondary | ICD-10-CM | POA: Diagnosis not present

## 2015-07-31 DIAGNOSIS — G8918 Other acute postprocedural pain: Secondary | ICD-10-CM | POA: Diagnosis not present

## 2015-07-31 DIAGNOSIS — K219 Gastro-esophageal reflux disease without esophagitis: Secondary | ICD-10-CM | POA: Diagnosis present

## 2015-07-31 DIAGNOSIS — M1712 Unilateral primary osteoarthritis, left knee: Principal | ICD-10-CM | POA: Diagnosis present

## 2015-07-31 HISTORY — DX: Unilateral primary osteoarthritis, left knee: M17.12

## 2015-07-31 HISTORY — PX: TOTAL KNEE ARTHROPLASTY: SHX125

## 2015-07-31 LAB — CBC
HEMATOCRIT: 34.8 % — AB (ref 36.0–46.0)
Hemoglobin: 11.3 g/dL — ABNORMAL LOW (ref 12.0–15.0)
MCH: 31.1 pg (ref 26.0–34.0)
MCHC: 32.5 g/dL (ref 30.0–36.0)
MCV: 95.9 fL (ref 78.0–100.0)
Platelets: 236 10*3/uL (ref 150–400)
RBC: 3.63 MIL/uL — AB (ref 3.87–5.11)
RDW: 14.7 % (ref 11.5–15.5)
WBC: 18.3 10*3/uL — AB (ref 4.0–10.5)

## 2015-07-31 LAB — CREATININE, SERUM
Creatinine, Ser: 0.85 mg/dL (ref 0.44–1.00)
GFR calc Af Amer: >60 mL/min (ref >60)

## 2015-07-31 SURGERY — ARTHROPLASTY, KNEE, TOTAL
Anesthesia: Spinal | Laterality: Left

## 2015-07-31 MED ORDER — MAGNESIUM 200 MG PO TABS
200.0000 mg | ORAL_TABLET | Freq: Every day | ORAL | Status: DC
  Administered 2015-07-31 – 2015-08-02 (×3): 200 mg via ORAL
  Filled 2015-07-31 (×7): qty 1

## 2015-07-31 MED ORDER — OXYCODONE HCL 5 MG PO TABS
5.0000 mg | ORAL_TABLET | ORAL | Status: DC | PRN
  Administered 2015-07-31 – 2015-08-01 (×3): 10 mg via ORAL
  Administered 2015-08-01: 5 mg via ORAL
  Administered 2015-08-01: 10 mg via ORAL
  Administered 2015-08-02: 5 mg via ORAL
  Administered 2015-08-02 – 2015-08-03 (×6): 10 mg via ORAL
  Administered 2015-08-03: 5 mg via ORAL
  Administered 2015-08-03 (×2): 10 mg via ORAL
  Filled 2015-07-31 (×9): qty 2
  Filled 2015-07-31: qty 1
  Filled 2015-07-31 (×2): qty 2
  Filled 2015-07-31: qty 1
  Filled 2015-07-31 (×2): qty 2

## 2015-07-31 MED ORDER — PROPOFOL 10 MG/ML IV BOLUS
INTRAVENOUS | Status: AC
  Filled 2015-07-31: qty 20

## 2015-07-31 MED ORDER — EPHEDRINE SULFATE 50 MG/ML IJ SOLN
INTRAMUSCULAR | Status: AC
  Filled 2015-07-31: qty 1

## 2015-07-31 MED ORDER — MENTHOL 3 MG MT LOZG: 1.0000 | LOZENGE | OROMUCOSAL | Status: DC | PRN

## 2015-07-31 MED ORDER — DIPHENHYDRAMINE HCL 12.5 MG/5ML PO ELIX: 12.5000 mg | ORAL_SOLUTION | ORAL | Status: DC | PRN

## 2015-07-31 MED ORDER — FENTANYL CITRATE (PF) 100 MCG/2ML IJ SOLN
INTRAMUSCULAR | Status: AC
  Administered 2015-07-31: 100 ug
  Filled 2015-07-31: qty 2

## 2015-07-31 MED ORDER — VANCOMYCIN HCL 1000 MG IV SOLR: 1000.0000 mg | INTRAVENOUS | Status: DC | PRN

## 2015-07-31 MED ORDER — POTASSIUM CHLORIDE IN NACL 20-0.9 MEQ/L-% IV SOLN
INTRAVENOUS | Status: DC
  Administered 2015-07-31 – 2015-08-01 (×2): via INTRAVENOUS
  Filled 2015-07-31 (×2): qty 1000

## 2015-07-31 MED ORDER — ONDANSETRON HCL 4 MG/2ML IJ SOLN: 4.0000 mg | Freq: Four times a day (QID) | INTRAMUSCULAR | Status: DC | PRN

## 2015-07-31 MED ORDER — ALUM & MAG HYDROXIDE-SIMETH 200-200-20 MG/5ML PO SUSP: 30.0000 mL | ORAL | Status: DC | PRN

## 2015-07-31 MED ORDER — DEXAMETHASONE SODIUM PHOSPHATE 10 MG/ML IJ SOLN
INTRAMUSCULAR | Status: DC | PRN
  Administered 2015-07-31: 10 mg via INTRAVENOUS

## 2015-07-31 MED ORDER — PHENOL 1.4 % MT LIQD: 1.0000 | OROMUCOSAL | Status: DC | PRN

## 2015-07-31 MED ORDER — VANCOMYCIN HCL IN DEXTROSE 1-5 GM/200ML-% IV SOLN
INTRAVENOUS | Status: AC
  Filled 2015-07-31: qty 200

## 2015-07-31 MED ORDER — POLYETHYLENE GLYCOL 3350 17 G PO PACK
17.0000 g | PACK | Freq: Two times a day (BID) | ORAL | Status: DC
  Administered 2015-07-31 – 2015-08-02 (×5): 17 g via ORAL
  Filled 2015-07-31 (×5): qty 1

## 2015-07-31 MED ORDER — PHENYLEPHRINE HCL 10 MG/ML IJ SOLN
INTRAMUSCULAR | Status: DC | PRN
  Administered 2015-07-31 (×2): 80 ug via INTRAVENOUS
  Administered 2015-07-31 (×3): 40 ug via INTRAVENOUS
  Administered 2015-07-31: 80 ug via INTRAVENOUS
  Administered 2015-07-31: 120 ug via INTRAVENOUS
  Administered 2015-07-31 (×8): 80 ug via INTRAVENOUS

## 2015-07-31 MED ORDER — KETOROLAC TROMETHAMINE 30 MG/ML IJ SOLN
INTRAMUSCULAR | Status: DC | PRN
  Administered 2015-07-31: 30 mg via INTRAVENOUS

## 2015-07-31 MED ORDER — LACTATED RINGERS IV SOLN
INTRAVENOUS | Status: DC
  Administered 2015-07-31: 10:00:00 via INTRAVENOUS

## 2015-07-31 MED ORDER — FENTANYL CITRATE (PF) 100 MCG/2ML IJ SOLN
INTRAMUSCULAR | Status: AC
  Filled 2015-07-31: qty 2

## 2015-07-31 MED ORDER — METOCLOPRAMIDE HCL 5 MG PO TABS: 5.0000 mg | ORAL_TABLET | Freq: Three times a day (TID) | ORAL | Status: DC | PRN

## 2015-07-31 MED ORDER — PHENYLEPHRINE 40 MCG/ML (10ML) SYRINGE FOR IV PUSH (FOR BLOOD PRESSURE SUPPORT)
PREFILLED_SYRINGE | INTRAVENOUS | Status: AC
  Filled 2015-07-31: qty 20

## 2015-07-31 MED ORDER — CELECOXIB 200 MG PO CAPS
200.0000 mg | ORAL_CAPSULE | Freq: Two times a day (BID) | ORAL | Status: DC
Start: 2015-07-31 — End: 2015-08-03
  Administered 2015-07-31 – 2015-08-03 (×6): 200 mg via ORAL
  Filled 2015-07-31 (×6): qty 1

## 2015-07-31 MED ORDER — TOBRAMYCIN SULFATE 1.2 G IJ SOLR
INTRAMUSCULAR | Status: DC | PRN
  Administered 2015-07-31 (×2): 1.2 g

## 2015-07-31 MED ORDER — KETOROLAC TROMETHAMINE 30 MG/ML IJ SOLN
INTRAMUSCULAR | Status: AC
Start: 2015-07-31 — End: 2015-07-31
  Filled 2015-07-31: qty 1

## 2015-07-31 MED ORDER — MIDAZOLAM HCL 2 MG/2ML IJ SOLN
INTRAMUSCULAR | Status: AC
  Administered 2015-07-31: 2 mg
  Filled 2015-07-31: qty 2

## 2015-07-31 MED ORDER — LORATADINE 10 MG PO TABS
10.0000 mg | ORAL_TABLET | Freq: Every day | ORAL | Status: DC
  Administered 2015-08-01 – 2015-08-03 (×3): 10 mg via ORAL
  Filled 2015-07-31 (×3): qty 1

## 2015-07-31 MED ORDER — PROMETHAZINE HCL 25 MG/ML IJ SOLN: 6.2500 mg | INTRAMUSCULAR | Status: DC | PRN

## 2015-07-31 MED ORDER — VITAMIN D 1000 UNITS PO TABS
5000.0000 [IU] | ORAL_TABLET | Freq: Every day | ORAL | Status: DC
  Administered 2015-07-31 – 2015-08-03 (×4): 5000 [IU] via ORAL
  Filled 2015-07-31 (×4): qty 5

## 2015-07-31 MED ORDER — CEFAZOLIN SODIUM-DEXTROSE 2-3 GM-% IV SOLR
2.0000 g | Freq: Four times a day (QID) | INTRAVENOUS | Status: AC
  Administered 2015-07-31: 2 g via INTRAVENOUS
  Filled 2015-07-31 (×3): qty 50

## 2015-07-31 MED ORDER — ENOXAPARIN SODIUM 30 MG/0.3ML ~~LOC~~ SOLN
30.0000 mg | Freq: Two times a day (BID) | SUBCUTANEOUS | Status: DC
  Administered 2015-08-01 – 2015-08-03 (×5): 30 mg via SUBCUTANEOUS
  Filled 2015-07-31 (×5): qty 0.3

## 2015-07-31 MED ORDER — POLYETHYL GLYCOL-PROPYL GLYCOL 0.4-0.3 % OP SOLN: 1.0000 [drp] | Freq: Every day | OPHTHALMIC | Status: DC

## 2015-07-31 MED ORDER — PROPOFOL 10 MG/ML IV BOLUS
INTRAVENOUS | Status: DC | PRN
  Administered 2015-07-31 (×2): 20 mg via INTRAVENOUS
  Administered 2015-07-31 (×2): 30 mg via INTRAVENOUS

## 2015-07-31 MED ORDER — DEXAMETHASONE SODIUM PHOSPHATE 10 MG/ML IJ SOLN
10.0000 mg | Freq: Three times a day (TID) | INTRAMUSCULAR | Status: AC
Start: 2015-07-31 — End: 2015-08-01
  Administered 2015-07-31 – 2015-08-01 (×3): 10 mg via INTRAVENOUS
  Filled 2015-07-31 (×3): qty 1

## 2015-07-31 MED ORDER — BUPIVACAINE-EPINEPHRINE (PF) 0.25% -1:200000 IJ SOLN
INTRAMUSCULAR | Status: AC
  Filled 2015-07-31: qty 30

## 2015-07-31 MED ORDER — PROPOFOL 10 MG/ML IV BOLUS
INTRAVENOUS | Status: AC
Start: 2015-07-31 — End: 2015-07-31
  Filled 2015-07-31: qty 20

## 2015-07-31 MED ORDER — BUPIVACAINE HCL (PF) 0.5 % IJ SOLN
INTRAMUSCULAR | Status: DC | PRN
  Administered 2015-07-31: 20 mL via PERINEURAL

## 2015-07-31 MED ORDER — METOCLOPRAMIDE HCL 5 MG/ML IJ SOLN: 5.0000 mg | Freq: Three times a day (TID) | INTRAMUSCULAR | Status: DC | PRN

## 2015-07-31 MED ORDER — DEXAMETHASONE SODIUM PHOSPHATE 4 MG/ML IJ SOLN
INTRAMUSCULAR | Status: AC
  Filled 2015-07-31: qty 3

## 2015-07-31 MED ORDER — LEVOTHYROXINE SODIUM 137 MCG PO TABS
137.0000 ug | ORAL_TABLET | Freq: Every day | ORAL | Status: DC
  Administered 2015-08-01 – 2015-08-03 (×3): 137 ug via ORAL
  Filled 2015-07-31 (×4): qty 1

## 2015-07-31 MED ORDER — HYDROMORPHONE HCL 1 MG/ML IJ SOLN
1.0000 mg | INTRAMUSCULAR | Status: DC | PRN
  Administered 2015-08-01 (×3): 1 mg via INTRAVENOUS
  Filled 2015-07-31 (×3): qty 1

## 2015-07-31 MED ORDER — HYDROMORPHONE HCL 1 MG/ML IJ SOLN
0.2500 mg | INTRAMUSCULAR | Status: DC | PRN
  Administered 2015-07-31: 0.5 mg via INTRAVENOUS

## 2015-07-31 MED ORDER — VANCOMYCIN HCL 1000 MG IV SOLR
1000.0000 mg | INTRAVENOUS | Status: DC | PRN
  Administered 2015-07-31: 1000 mg via INTRAVENOUS

## 2015-07-31 MED ORDER — PHENYLEPHRINE 40 MCG/ML (10ML) SYRINGE FOR IV PUSH (FOR BLOOD PRESSURE SUPPORT)
PREFILLED_SYRINGE | INTRAVENOUS | Status: AC
Start: 2015-07-31 — End: 2015-07-31
  Filled 2015-07-31: qty 10

## 2015-07-31 MED ORDER — ONDANSETRON HCL 4 MG/2ML IJ SOLN
INTRAMUSCULAR | Status: DC | PRN
  Administered 2015-07-31: 4 mg via INTRAVENOUS

## 2015-07-31 MED ORDER — DOCUSATE SODIUM 100 MG PO CAPS
100.0000 mg | ORAL_CAPSULE | Freq: Two times a day (BID) | ORAL | Status: DC
  Administered 2015-07-31 – 2015-08-02 (×5): 100 mg via ORAL
  Filled 2015-07-31 (×5): qty 1

## 2015-07-31 MED ORDER — ACETAMINOPHEN 325 MG PO TABS
650.0000 mg | ORAL_TABLET | Freq: Four times a day (QID) | ORAL | Status: DC | PRN
  Administered 2015-08-02 – 2015-08-03 (×2): 650 mg via ORAL
  Filled 2015-07-31 (×2): qty 2

## 2015-07-31 MED ORDER — ACETAMINOPHEN 650 MG RE SUPP: 650.0000 mg | Freq: Four times a day (QID) | RECTAL | Status: DC | PRN

## 2015-07-31 MED ORDER — EPHEDRINE SULFATE 50 MG/ML IJ SOLN
INTRAMUSCULAR | Status: DC | PRN
  Administered 2015-07-31: 5 mg via INTRAVENOUS

## 2015-07-31 MED ORDER — ONDANSETRON HCL 4 MG PO TABS: 4.0000 mg | ORAL_TABLET | Freq: Four times a day (QID) | ORAL | Status: DC | PRN

## 2015-07-31 MED ORDER — BUPIVACAINE-EPINEPHRINE 0.25% -1:200000 IJ SOLN
INTRAMUSCULAR | Status: DC | PRN
  Administered 2015-07-31: 30 mL

## 2015-07-31 MED ORDER — PANTOPRAZOLE SODIUM 40 MG PO TBEC
40.0000 mg | DELAYED_RELEASE_TABLET | Freq: Every day | ORAL | Status: DC
  Administered 2015-08-01 – 2015-08-03 (×3): 40 mg via ORAL
  Filled 2015-07-31 (×3): qty 1

## 2015-07-31 MED ORDER — PROPOFOL 500 MG/50ML IV EMUL
INTRAVENOUS | Status: DC | PRN
  Administered 2015-07-31: 12:00:00 via INTRAVENOUS
  Administered 2015-07-31: 50 ug/kg/min via INTRAVENOUS

## 2015-07-31 MED ORDER — VITAMIN D-3 125 MCG (5000 UT) PO TABS: 5000.0000 [IU] | ORAL_TABLET | Freq: Every day | ORAL | Status: DC

## 2015-07-31 MED ORDER — POLYVINYL ALCOHOL 1.4 % OP SOLN
1.0000 [drp] | Freq: Every day | OPHTHALMIC | Status: DC
  Administered 2015-07-31 – 2015-08-02 (×3): 1 [drp] via OPHTHALMIC
  Filled 2015-07-31: qty 15

## 2015-07-31 MED ORDER — HYDROMORPHONE HCL 1 MG/ML IJ SOLN
INTRAMUSCULAR | Status: AC
  Filled 2015-07-31: qty 1

## 2015-07-31 MED ORDER — FLUTICASONE PROPIONATE 50 MCG/ACT NA SUSP
2.0000 | Freq: Every day | NASAL | Status: DC
  Administered 2015-07-31 – 2015-08-02 (×3): 2 via NASAL
  Filled 2015-07-31: qty 16

## 2015-07-31 MED ORDER — TOBRAMYCIN SULFATE 1.2 G IJ SOLR
INTRAMUSCULAR | Status: AC
Start: 2015-07-31 — End: 2015-07-31
  Filled 2015-07-31: qty 2.4

## 2015-07-31 MED ORDER — SODIUM CHLORIDE 0.9 % IR SOLN
Status: DC | PRN
  Administered 2015-07-31: 3000 mL

## 2015-07-31 MED ORDER — FENTANYL CITRATE (PF) 250 MCG/5ML IJ SOLN
INTRAMUSCULAR | Status: AC
  Filled 2015-07-31: qty 5

## 2015-07-31 MED ORDER — MIDAZOLAM HCL 2 MG/2ML IJ SOLN
INTRAMUSCULAR | Status: AC
  Filled 2015-07-31: qty 2

## 2015-07-31 SURGICAL SUPPLY — 81 items
APL SKNCLS STERI-STRIP NONHPOA (GAUZE/BANDAGES/DRESSINGS) ×1
BANDAGE ELASTIC 6 VELCRO ST LF (GAUZE/BANDAGES/DRESSINGS) ×3 IMPLANT
BANDAGE ESMARK 6X9 LF (GAUZE/BANDAGES/DRESSINGS) ×1 IMPLANT
BENZOIN TINCTURE PRP APPL 2/3 (GAUZE/BANDAGES/DRESSINGS) ×3 IMPLANT
BLADE SAGITTAL 25.0X1.19X90 (BLADE) ×2 IMPLANT
BLADE SAGITTAL 25.0X1.19X90MM (BLADE) ×1
BLADE SAW SGTL 11.0X1.19X90.0M (BLADE) IMPLANT
BLADE SAW SGTL 13.0X1.19X90.0M (BLADE) ×3 IMPLANT
BLADE SURG 10 STRL SS (BLADE) ×6 IMPLANT
BNDG CMPR 9X6 STRL LF SNTH (GAUZE/BANDAGES/DRESSINGS) ×1
BNDG CMPR MED 15X6 ELC VLCR LF (GAUZE/BANDAGES/DRESSINGS) ×1
BNDG ELASTIC 6X15 VLCR STRL LF (GAUZE/BANDAGES/DRESSINGS) ×3 IMPLANT
BNDG ESMARK 6X9 LF (GAUZE/BANDAGES/DRESSINGS) ×3
BOWL SMART MIX CTS (DISPOSABLE) ×3 IMPLANT
CAPT KNEE TOTAL 3 ATTUNE ×3 IMPLANT
CEMENT HV SMART SET (Cement) ×6 IMPLANT
CLOSURE STERI-STRIP 1/2X4 (GAUZE/BANDAGES/DRESSINGS) ×1
CLOSURE WOUND 1/2 X4 (GAUZE/BANDAGES/DRESSINGS) ×1
CLSR STERI-STRIP ANTIMIC 1/2X4 (GAUZE/BANDAGES/DRESSINGS) ×2 IMPLANT
COVER SURGICAL LIGHT HANDLE (MISCELLANEOUS) ×3 IMPLANT
CUFF TOURNIQUET SINGLE 34IN LL (TOURNIQUET CUFF) ×3 IMPLANT
CUFF TOURNIQUET SINGLE 44IN (TOURNIQUET CUFF) IMPLANT
DECANTER SPIKE VIAL GLASS SM (MISCELLANEOUS) ×3 IMPLANT
DRAPE EXTREMITY T 121X128X90 (DRAPE) ×3 IMPLANT
DRAPE INCISE IOBAN 66X45 STRL (DRAPES) ×3 IMPLANT
DRAPE PROXIMA HALF (DRAPES) ×3 IMPLANT
DRAPE U-SHAPE 47X51 STRL (DRAPES) ×3 IMPLANT
DRSG AQUACEL AG ADV 3.5X14 (GAUZE/BANDAGES/DRESSINGS) ×3 IMPLANT
DRSG PAD ABDOMINAL 8X10 ST (GAUZE/BANDAGES/DRESSINGS) ×6 IMPLANT
DURAPREP 26ML APPLICATOR (WOUND CARE) ×6 IMPLANT
ELECT CAUTERY BLADE 6.4 (BLADE) ×3 IMPLANT
ELECT REM PT RETURN 9FT ADLT (ELECTROSURGICAL) ×3
ELECTRODE REM PT RTRN 9FT ADLT (ELECTROSURGICAL) ×1 IMPLANT
EVACUATOR 1/8 PVC DRAIN (DRAIN) ×3 IMPLANT
FACESHIELD WRAPAROUND (MASK) ×3 IMPLANT
GAUZE SPONGE 4X4 12PLY STRL (GAUZE/BANDAGES/DRESSINGS) ×3 IMPLANT
GLOVE BIO SURGEON STRL SZ7 (GLOVE) ×6 IMPLANT
GLOVE BIOGEL M STER SZ 6 (GLOVE) ×3 IMPLANT
GLOVE BIOGEL PI IND STRL 6.5 (GLOVE) ×1 IMPLANT
GLOVE BIOGEL PI IND STRL 7.0 (GLOVE) ×1 IMPLANT
GLOVE BIOGEL PI IND STRL 7.5 (GLOVE) ×1 IMPLANT
GLOVE BIOGEL PI INDICATOR 6.5 (GLOVE) ×2
GLOVE BIOGEL PI INDICATOR 7.0 (GLOVE) ×2
GLOVE BIOGEL PI INDICATOR 7.5 (GLOVE) ×2
GLOVE SS BIOGEL STRL SZ 7.5 (GLOVE) ×1 IMPLANT
GLOVE SUPERSENSE BIOGEL SZ 7.5 (GLOVE) ×2
GOWN STRL REUS W/ TWL LRG LVL3 (GOWN DISPOSABLE) ×1 IMPLANT
GOWN STRL REUS W/ TWL XL LVL3 (GOWN DISPOSABLE) ×2 IMPLANT
GOWN STRL REUS W/TWL LRG LVL3 (GOWN DISPOSABLE) ×3
GOWN STRL REUS W/TWL XL LVL3 (GOWN DISPOSABLE) ×6
HANDPIECE INTERPULSE COAX TIP (DISPOSABLE) ×3
HOOD PEEL AWAY FACE SHEILD DIS (HOOD) ×6 IMPLANT
IMMOBILIZER KNEE 22 UNIV (SOFTGOODS) ×3 IMPLANT
KIT BASIN OR (CUSTOM PROCEDURE TRAY) ×3 IMPLANT
KIT ROOM TURNOVER OR (KITS) ×3 IMPLANT
MANIFOLD NEPTUNE II (INSTRUMENTS) ×3 IMPLANT
MARKER SKIN DUAL TIP RULER LAB (MISCELLANEOUS) ×3 IMPLANT
NS IRRIG 1000ML POUR BTL (IV SOLUTION) ×3 IMPLANT
PACK TOTAL JOINT (CUSTOM PROCEDURE TRAY) ×3 IMPLANT
PACK UNIVERSAL I (CUSTOM PROCEDURE TRAY) ×3 IMPLANT
PAD ARMBOARD 7.5X6 YLW CONV (MISCELLANEOUS) ×6 IMPLANT
PADDING CAST COTTON 6X4 STRL (CAST SUPPLIES) ×3 IMPLANT
RUBBERBAND STERILE (MISCELLANEOUS) ×3 IMPLANT
SET HNDPC FAN SPRY TIP SCT (DISPOSABLE) ×1 IMPLANT
SPONGE GAUZE 4X4 12PLY STER LF (GAUZE/BANDAGES/DRESSINGS) ×3 IMPLANT
STRIP CLOSURE SKIN 1/2X4 (GAUZE/BANDAGES/DRESSINGS) ×2 IMPLANT
SUCTION FRAZIER TIP 10 FR DISP (SUCTIONS) ×3 IMPLANT
SUT MNCRL AB 3-0 PS2 18 (SUTURE) ×3 IMPLANT
SUT VIC AB 0 CT1 27 (SUTURE) ×6
SUT VIC AB 0 CT1 27XBRD ANBCTR (SUTURE) ×2 IMPLANT
SUT VIC AB 1 CT1 27 (SUTURE) ×3
SUT VIC AB 1 CT1 27XBRD ANBCTR (SUTURE) ×1 IMPLANT
SUT VIC AB 2-0 CT1 27 (SUTURE) ×6
SUT VIC AB 2-0 CT1 TAPERPNT 27 (SUTURE) ×2 IMPLANT
SYR 30ML SLIP (SYRINGE) ×3 IMPLANT
TOWEL OR 17X24 6PK STRL BLUE (TOWEL DISPOSABLE) ×3 IMPLANT
TOWEL OR 17X26 10 PK STRL BLUE (TOWEL DISPOSABLE) ×3 IMPLANT
TRAY FOLEY CATH 16FR SILVER (SET/KITS/TRAYS/PACK) ×3 IMPLANT
TUBE CONNECTING 12'X1/4 (SUCTIONS) ×1
TUBE CONNECTING 12X1/4 (SUCTIONS) ×2 IMPLANT
YANKAUER SUCT BULB TIP NO VENT (SUCTIONS) ×3 IMPLANT

## 2015-07-31 NOTE — Transfer of Care (Signed)
Immediate Anesthesia Transfer of Care Note  Patient: Evelyn Walker  Procedure(s) Performed: Procedure(s): TOTAL KNEE ARTHROPLASTY (Left)  Patient Location: PACU  Anesthesia Type:Regional and Spinal  Level of Consciousness: awake, alert  and oriented  Airway & Oxygen Therapy: Patient Spontanous Breathing  Post-op Assessment: Report given to RN and Post -op Vital signs reviewed and stable  Post vital signs: Reviewed and stable  Last Vitals:  Filed Vitals:   07/31/15 0910  BP: 158/87  Pulse: 78  Temp: 36.9 C  Resp: 20    Complications: No apparent anesthesia complications

## 2015-07-31 NOTE — Anesthesia Procedure Notes (Addendum)
Spinal Patient location during procedure: OR Staffing Performed by: anesthesiologist  Preanesthetic Checklist Completed: patient identified, site marked, surgical consent, pre-op evaluation, timeout performed, IV checked, risks and benefits discussed and monitors and equipment checked Spinal Block Patient position: sitting Prep: Betadine Patient monitoring: heart rate, continuous pulse ox and blood pressure Injection technique: single-shot Needle Needle type: Sprotte  Needle gauge: 24 G Needle length: 9 cm Additional Notes Expiration date of kit checked and confirmed. Patient tolerated procedure well, without complications.    Anesthesia Regional Block:  Femoral nerve block  Pre-Anesthetic Checklist: ,, timeout performed, Correct Patient, Correct Site, Correct Laterality, Correct Procedure, Correct Position, site marked, Risks and benefits discussed,  Surgical consent,  Pre-op evaluation,  At surgeon's request and post-op pain management   Prep: chloraprep       Needles:  Injection technique: Single-shot  Needle Type: Echogenic Stimulator Needle     Needle Length: 9cm 9 cm Needle Gauge: 21 and 21 G    Additional Needles:  Procedures: ultrasound guided (picture in chart) Femoral nerve block  Nerve Stimulator or Paresthesia:  Response: 0.45 mA,   Additional Responses:   Narrative:  Injection made incrementally with aspirations every 5 mL.  Performed by: Personally   Additional Notes: Patient tolerated the procedure well without complications   Procedure Name: MAC Date/Time: 07/31/2015 10:30 AM Performed by: Duke Salvia Pre-anesthesia Checklist: Patient identified, Emergency Drugs available, Suction available, Patient being monitored and Timeout performed Oxygen Delivery Method: Non-rebreather mask

## 2015-07-31 NOTE — Progress Notes (Signed)
Physical Therapy Evaluation Patient Details Name: Evelyn Walker MRN: [id number] DOB: Sep 18, 1935 Today's Date: 07/31/2015   History of Present Illness  80 y.o. female s/p Lt total knee arthroplasty.  Clinical Impression  Pt is s/p left TKA, presenting with the deficits listed below (see PT Problem List). Demonstrates significant quad weakness post op day #0. Required min assist to block knee with knee immobilizer in place for sit<>stand transfer and pivot to chair. Reviewed use of zero knee, exercises, and precautions to promote optimal healing. Would need to be able to traverse full flight of stairs to safely go home. Will progress as tolerated. Pt will benefit from skilled PT to increase their independence and safety with mobility to allow discharge to the venue listed below.      Follow Up Recommendations SNF;Supervision for mobility/OOB    Equipment Recommendations  None recommended by PT    Recommendations for Other Services       Precautions / Restrictions Precautions Precautions: Knee Precaution Booklet Issued: Yes (comment) Precaution Comments: reviewed precautions Required Braces or Orthoses: Knee Immobilizer - Left Knee Immobilizer - Left: On when out of bed or walking;Discontinue once straight leg raise with < 10 degree lag Restrictions Weight Bearing Restrictions: Yes LLE Weight Bearing: Weight bearing as tolerated      Mobility  Bed Mobility Overal bed mobility: Needs Assistance Bed Mobility: Supine to Sit     Supine to sit: Min guard     General bed mobility comments: Min guard for safety. Requires extra time, use of rail. Cues for technique.  Transfers Overall transfer level: Needs assistance Equipment used: Rolling walker (2 wheeled) Transfers: Sit to/from [facility] Sit to Stand: Min assist Stand pivot transfers: Min assist       General transfer comment: Min assist for boost with Lt knee block due to buckling. VC for hand  placement. Instructions for weight-shifting prior to pivot which required min assist for Lt knee block and instructions for walker control and UE use for support.   Ambulation/Gait                Stairs            Wheelchair Mobility    Modified Rankin (Stroke Patients Only)       Balance Overall balance assessment: Needs assistance Sitting-balance support: No upper extremity supported;Feet supported Sitting balance-Leahy Scale: Good     Standing balance support: Bilateral upper extremity supported Standing balance-Leahy Scale: Poor                               Pertinent Vitals/Pain Pain Assessment: 0-10 Pain Score:  (" a little bit." no value given) Pain Location: Lt knee Pain Descriptors / Indicators: Aching;Pressure Pain Intervention(s): Repositioned;Limited activity within patient's tolerance;Monitored during session;Premedicated before session    Home Living Family/patient expects to be discharged to:: Skilled nursing facility Living Arrangements: Spouse/significant other Available Help at Discharge: Family;Available 24 hours/day Type of Home: House Home Access: Stairs to enter Entrance Stairs-Rails: Psychiatric nurse of Steps: 7 Home Layout: Two level;Bed/bath upstairs Home Equipment: Shower seat - built in;Other (comment);Walker - 2 wheels (very low shower seat; 3 in 1)      Prior Function Level of Independence: Independent               Hand Dominance   Dominant Hand: Right    Extremity/Trunk Assessment   Upper Extremity Assessment: Defer to OT evaluation  Lower Extremity Assessment: LLE deficits/detail   LLE Deficits / Details: decreased strength and ROM as expected post op. Very limited quad activation at this time.     Communication   Communication: No difficulties  Cognition Arousal/Alertness: Awake/alert Behavior During Therapy: WFL for tasks assessed/performed Overall Cognitive  Status: Within Functional Limits for tasks assessed                      General Comments      Exercises Total Joint Exercises Ankle Circles/Pumps: AROM;Both;10 reps;Seated Quad Sets: Strengthening;Left;10 reps;Seated      Assessment/Plan    PT Assessment Patient needs continued PT services  PT Diagnosis Difficulty walking;Abnormality of gait;Acute pain   PT Problem List Decreased strength;Decreased range of motion;Decreased activity tolerance;Decreased balance;Decreased mobility;Decreased knowledge of use of DME;Decreased knowledge of precautions;Pain  PT Treatment Interventions DME instruction;Gait training;Stair training;Functional mobility training;Therapeutic activities;Therapeutic exercise;Balance training;Neuromuscular re-education;Patient/family education;Modalities   PT Goals (Current goals can be found in the Care Plan section) Acute Rehab PT Goals Patient Stated Goal: Get well before going home PT Goal Formulation: With patient/family Time For Goal Achievement: 08/07/15 Potential to Achieve Goals: Good    Frequency 7X/week   Barriers to discharge Inaccessible home environment Full flight of stairs to main bed/bath    Co-evaluation               End of Session Equipment Utilized During Treatment: Gait belt Activity Tolerance: Patient tolerated treatment well Patient left: in chair;with call bell/phone within reach;with family/visitor present;with SCD's reapplied Nurse Communication: Mobility status;Precautions;Weight bearing status;Other (comment) (use knee immobilizer)         Time: 3903-0092 PT Time Calculation (min) (ACUTE ONLY): 30 min   Charges:   PT Evaluation $Initial PT Evaluation Tier I: 1 Procedure PT Treatments $Therapeutic Activity: 8-22 mins   PT G Codes:        Ellouise Newer 07/31/2015, 6:27 PM  Camille Bal [place], [place]

## 2015-07-31 NOTE — Op Note (Signed)
MRN:     [id number] DOB/AGE:    [birth year] / 80 y.o.       OPERATIVE REPORT    DATE OF PROCEDURE:  07/31/2015       PREOPERATIVE DIAGNOSIS:   PRIMARY LOCALIZED OA LEFT KNEE      Estimated body mass index is 27.12 kg/(m^2) as calculated from the following:   Height as of this encounter: 5\' 4"  (1.626 m).   Weight as of this encounter: 71.714 kg (158 lb 1.6 oz).                                                        POSTOPERATIVE DIAGNOSIS:   SAME                                                                     PROCEDURE:  Procedure(s): TOTAL KNEE ARTHROPLASTY Using Depuy Attune RP implants #5 Femur, #5Tibia, 49mm Attune RP bearing, 32 Patella     SURGEON: Zeenat Jeanbaptiste A    ASSISTANT:  Kirstin Shepperson PA-C   (Present and scrubbed throughout the case, critical for assistance with exposure, retraction, instrumentation, and closure.)         ANESTHESIA: Spinal with Femoral Nerve Block  DRAINS: foley, 2 medium hemovac in knee   TOURNIQUET TIME: 93GHW   COMPLICATIONS:  None     SPECIMENS: None   INDICATIONS FOR PROCEDURE: The patient has  DJD LEFT KNEE, varus deformities, XR shows bone on bone arthritis. Patient has failed all conservative measures including anti-inflammatory medicines, narcotics, attempts at  exercise and weight loss, cortisone injections and viscosupplementation.  Risks and benefits of surgery have been discussed, questions answered.   DESCRIPTION OF PROCEDURE: The patient identified by armband, received  right femoral nerve block and IV antibiotics, in the holding area at [facility]. Patient taken to the operating room, appropriate anesthetic  monitors were attached General endotracheal anesthesia induced with  the patient in supine position, Foley catheter was inserted. Tourniquet  applied high to the operative thigh. Lateral post and foot positioner  applied to the table, the lower extremity was then prepped and draped  in usual sterile fashion from  the ankle to the tourniquet. Time-out procedure was performed. The limb was wrapped with an Esmarch bandage and the tourniquet inflated to 365 mmHg. We began the operation by making the anterior midline incision starting at handbreadth above the patella going over the patella 1 cm medial to and  4 cm distal to the tibial tubercle. Small bleeders in the skin and the  subcutaneous tissue identified and cauterized. Transverse retinaculum was incised and reflected medially and a medial parapatellar arthrotomy was accomplished. the patella was everted and theprepatellar fat pad resected. The superficial medial collateral  ligament was then elevated from anterior to posterior along the proximal  flare of the tibia and anterior half of the menisci resected. The knee was hyperflexed exposing bone on bone arthritis. Peripheral and notch osteophytes as well as the cruciate ligaments were then resected. We continued to  work our way around posteriorly along the proximal tibia, and  externally  rotated the tibia subluxing it out from underneath the femur. A McHale  retractor was placed through the notch and a lateral Hohmann retractor  placed, and we then drilled through the proximal tibia in line with the  axis of the tibia followed by an intramedullary guide rod and 2-degree  posterior slope cutting guide. The tibial cutting guide was pinned into place  allowing resection of 6 mm of bone medially and about 4 mm of bone  laterally because of her valgus deformity. Satisfied with the tibial resection, we then  entered the distal femur 2 mm anterior to the PCL origin with the  intramedullary guide rod and applied the distal femoral cutting guide  set at 60mm, with 5 degrees of valgus. This was pinned along the  epicondylar axis. At this point, the distal femoral cut was accomplished without difficulty. We then sized for a #5 femoral component and pinned the guide in 3 degrees of external rotation.The chamfer  cutting guide was pinned into place. The anterior, posterior, and chamfer cuts were accomplished without difficulty followed by  the Attune RP box cutting guide and the box cut. We also removed posterior osteophytes from the posterior femoral condyles. At this  time, the knee was brought into full extension. We checked our  extension and flexion gaps and found them symmetric at 74mm.  The patella thickness measured at 23 mm. We set the cutting guide at 15 and removed the posterior 8 mm  of the patella sized for 32 button and drilled the lollipop. The knee  was then once again hyperflexed exposing the proximal tibia. We sized for a #5 tibial base plate, applied the smokestack and the conical reamer followed by the the Delta fin keel punch. We then hammered into place the Attune RP trial femoral component, inserted a 6-mm trial bearing, trial patellar button, and took the knee through range of motion from 0-130 degrees. No thumb pressure was required for patellar  tracking. At this point, all trial components were removed, a double batch of DePuy HV cement with 1500 mg of Tobra was mixed and applied to all bony metallic mating surfaces except for the posterior condyles of the femur itself. In order, we  hammered into place the tibial tray and removed excess cement, the femoral component and removed excess cement, a 6-mm Attune RP bearing  was inserted, and the knee brought to full extension with compression.  The patellar button was clamped into place, and excess cement  removed. While the cement cured the wound was irrigated out with normal saline solution pulse lavage, and medium Hemovac drains were placed.. Ligament stability and patellar tracking were checked and found to be excellent. The tourniquet was then released and hemostasis was obtained with cautery. The parapatellar arthrotomy was closed with  #1 ethibond suture. The subcutaneous tissue with 0 and 2-0 undyed  Vicryl suture, and 4-0 Monocryl..  A dressing of Xeroform,  4 x 4, dressing sponges, Webril, and Ace wrap applied. Needle and sponge count were correct times 2.The patient awakened, extubated, and taken to recovery room without difficulty. Vascular status was normal, pulses 2+ and symmetric.   Bert Ptacek A 07/31/2015, 12:13 PM

## 2015-07-31 NOTE — Interval H&P Note (Signed)
History and Physical Interval Note:  07/31/2015 10:40 AM  Evelyn Walker  has presented today for surgery, with the diagnosis of PRIMARY LOCALIZED OA LEFT KNEE  The various methods of treatment have been discussed with the patient and family. After consideration of risks, benefits and other options for treatment, the patient has consented to  Procedure(s): TOTAL KNEE ARTHROPLASTY (Left) as a surgical intervention .  The patient's history has been reviewed, patient examined, no change in status, stable for surgery.  I have reviewed the patient's chart and labs.  Questions were answered to the patient's satisfaction.     [name]

## 2015-07-31 NOTE — Progress Notes (Signed)
Orthopedic Tech Progress Note Patient Details:  Evelyn Walker 1935/04/17 [id number]  CPM Left Knee CPM Left Knee: On Left Knee Flexion (Degrees): 90 Left Knee Extension (Degrees): 0 Additional Comments: trapeze bar patient helper Viewed order from doctor's order list  Hildred Priest 07/31/2015, 1:32 PM

## 2015-07-31 NOTE — Anesthesia Preprocedure Evaluation (Signed)
Anesthesia Evaluation  Patient identified by MRN, date of birth, ID band Patient awake    Reviewed: Allergy & Precautions, NPO status , Patient's Chart, lab work & pertinent test results  Airway Mallampati: II  TM Distance: >3 FB Neck ROM: Full    Dental no notable dental hx.    Pulmonary neg pulmonary ROS, former smoker,    Pulmonary exam normal breath sounds clear to auscultation       Cardiovascular hypertension, Pt. on medications Normal cardiovascular exam Rhythm:Regular Rate:Normal     Neuro/Psych negative neurological ROS  negative psych ROS   GI/Hepatic negative GI ROS, Neg liver ROS,   Endo/Other  Hypothyroidism   Renal/GU negative Renal ROS  negative genitourinary   Musculoskeletal negative musculoskeletal ROS (+)   Abdominal   Peds negative pediatric ROS (+)  Hematology negative hematology ROS (+)   Anesthesia Other Findings   Reproductive/Obstetrics negative OB ROS                             Anesthesia Physical Anesthesia Plan  ASA: II  Anesthesia Plan: Spinal   Post-op Pain Management:    Induction: Intravenous  Airway Management Planned: Simple Face Mask  Additional Equipment:   Intra-op Plan:   Post-operative Plan:   Informed Consent: I have reviewed the patients History and Physical, chart, labs and discussed the procedure including the risks, benefits and alternatives for the proposed anesthesia with the patient or authorized representative who has indicated his/her understanding and acceptance.   Dental advisory given  Plan Discussed with: CRNA and Surgeon  Anesthesia Plan Comments:         Anesthesia Quick Evaluation

## 2015-07-31 NOTE — Anesthesia Postprocedure Evaluation (Signed)
  Anesthesia Post-op Note  Patient: Evelyn Walker  Procedure(s) Performed: Procedure(s) (LRB): TOTAL KNEE ARTHROPLASTY (Left)  Patient Location: PACU  Anesthesia Type: Spinal  Level of Consciousness: awake and alert   Airway and Oxygen Therapy: Patient Spontanous Breathing  Post-op Pain: mild  Post-op Assessment: Post-op Vital signs reviewed, Patient's Cardiovascular Status Stable, Respiratory Function Stable, Patent Airway and No signs of Nausea or vomiting  Last Vitals:  Filed Vitals:   07/31/15 1244  BP: 93/59  Pulse: 73  Temp:   Resp: 14    Post-op Vital Signs: stable   Complications: No apparent anesthesia complications

## 2015-07-31 NOTE — Progress Notes (Signed)
Orthopedic Tech Progress Note Patient Details:  Evelyn Walker 29-Oct-1934 [id number] On cpm at 7:00 pm Patient ID: Evelyn Walker, female   DOB: July 28, 1935, 80 y.o.   MRN: [id number]   Braulio Bosch 07/31/2015, 7:02 PM

## 2015-07-31 NOTE — Progress Notes (Signed)
Utilization review completed.  

## 2015-08-01 ENCOUNTER — Encounter (HOSPITAL_COMMUNITY): Payer: Self-pay | Admitting: Orthopedic Surgery

## 2015-08-01 LAB — CBC
HCT: 28.4 % — ABNORMAL LOW (ref 36.0–46.0)
HEMOGLOBIN: 9.3 g/dL — AB (ref 12.0–15.0)
MCH: 30.9 pg (ref 26.0–34.0)
MCHC: 32.7 g/dL (ref 30.0–36.0)
MCV: 94.4 fL (ref 78.0–100.0)
Platelets: 213 10*3/uL (ref 150–400)
RBC: 3.01 MIL/uL — AB (ref 3.87–5.11)
RDW: 14.7 % (ref 11.5–15.5)
WBC: 11.6 10*3/uL — ABNORMAL HIGH (ref 4.0–10.5)

## 2015-08-01 LAB — BASIC METABOLIC PANEL
Anion gap: 6 (ref 5–15)
BUN: 13 mg/dL (ref 6–20)
CHLORIDE: 103 mmol/L (ref 101–111)
CO2: 26 mmol/L (ref 22–32)
Calcium: 8.3 mg/dL — ABNORMAL LOW (ref 8.9–10.3)
Creatinine, Ser: 0.62 mg/dL (ref 0.44–1.00)
GFR calc Af Amer: >60 mL/min (ref >60)
GFR calc non Af Amer: >60 mL/min (ref >60)
GLUCOSE: 139 mg/dL — AB (ref 65–99)
POTASSIUM: 4 mmol/L (ref 3.5–5.1)
Sodium: 135 mmol/L (ref 135–145)

## 2015-08-01 NOTE — Clinical Social Work Note (Signed)
Patient has a bed at [facility] once medically stable for discharge. BSW intern to contact patient's spouse, Meda Coffee in reference to patient's discharge plan.   BSW intern remains available as needed.  [employer] Intern [phone]

## 2015-08-01 NOTE — Progress Notes (Signed)
Physical Therapy Treatment Patient Details Name: Evelyn Walker MRN: [id number] DOB: 28-Sep-1935 Today's Date: 08/01/2015    History of Present Illness 80 y.o. female s/p Lt total knee arthroplasty.    PT Comments    Continuing progress, but still with decr stability L knee; Will need to show more progress and stability in standing and walking to be able to dc home; SW has begin process for SNF (thanks!)  Follow Up Recommendations  Home health PT;SNF     Equipment Recommendations  None recommended by PT    Recommendations for Other Services       Precautions / Restrictions Precautions Precautions: Knee Required Braces or Orthoses: Knee Immobilizer - Left Knee Immobilizer - Left: On when out of bed or walking;Discontinue once straight leg raise with < 10 degree lag Restrictions Weight Bearing Restrictions: Yes LLE Weight Bearing: Weight bearing as tolerated    Mobility  Bed Mobility Overal bed mobility: Needs Assistance Bed Mobility: Supine to Sit     Supine to sit: Min assist     General bed mobility comments: Cues for technique Min assist for LLE  Transfers Overall transfer level: Needs assistance Equipment used: Rolling walker (2 wheeled) Transfers: Sit to/from Stand Sit to Stand: Min assist         General transfer comment: Min A for steadyness and to boost up. Good hand placement and technique. Sit <> stand from chair x 1  Ambulation/Gait Ambulation/Gait assistance: Min assist;+2 safety/equipment;Mod assist Ambulation Distance (Feet): 80 Feet Assistive device: Rolling walker (2 wheeled) Gait Pattern/deviations: Step-through pattern     General Gait Details: Cues to activate L quad for L stance stability; second person pushing chair behind for safety and to boost confidence to incr amb; 3-5 moments of L knee buckling requiring mod assist to prevent fall   Stairs            Wheelchair Mobility    Modified Rankin (Stroke Patients Only)        Balance Overall balance assessment: Needs assistance Sitting-balance support: Feet supported;No upper extremity supported Sitting balance-Leahy Scale: Good     Standing balance support: Bilateral upper extremity supported Standing balance-Leahy Scale: Poor Standing balance comment: RW for support                    Cognition Arousal/Alertness: Awake/alert Behavior During Therapy: WFL for tasks assessed/performed Overall Cognitive Status: Within Functional Limits for tasks assessed                      Exercises      General Comments        Pertinent Vitals/Pain Pain Assessment: Faces Faces Pain Scale: Hurts even more Pain Location: L knee Pain Descriptors / Indicators: Aching Pain Intervention(s): Limited activity within patient's tolerance;Monitored during session    Home Living Family/patient expects to be discharged to:: Skilled nursing facility Living Arrangements: Spouse/significant other Available Help at Discharge: Family;Available 24 hours/day Type of Home: House Home Access: Stairs to enter Entrance Stairs-Rails: Right;Left Home Layout: Two level;Bed/bath upstairs Home Equipment: Shower seat - built in;Other (comment);Walker - 2 wheels;Bedside commode (very low shower seat)      Prior Function Level of Independence: Independent          PT Goals (current goals can now be found in the care plan section) Acute Rehab PT Goals Patient Stated Goal: Get well before going home PT Goal Formulation: With patient/family Time For Goal Achievement: 08/07/15 Potential to Achieve Goals: Good  Progress towards PT goals: Progressing toward goals    Frequency  7X/week    PT Plan Discharge plan needs to be updated    Co-evaluation             End of Session Equipment Utilized During Treatment: Gait belt Activity Tolerance: Patient tolerated treatment well Patient left: in chair;with call bell/phone within reach;with family/visitor present      Time: 7711-6579 PT Time Calculation (min) (ACUTE ONLY): 32 min  Charges:  $Gait Training: 8-22 mins $Therapeutic Activity: 8-22 mins                    G Codes:      Quin Hoop 08/01/2015, 4:46 PM  Roney Marion, [place] Pager [phone] Office [phone]

## 2015-08-01 NOTE — Clinical Social Work Placement (Signed)
   CLINICAL SOCIAL WORK PLACEMENT  NOTE  Date:  08/01/2015  Patient Details  Name: Evelyn Walker MRN: [id number] Date of Birth: 04-Apr-1935  Clinical Social Work is seeking post-discharge placement for this patient at the [place] level of care (*CSW will initial, date and re-position this form in  chart as items are completed):  Yes   Patient/family provided with [place] Work Department's list of facilities offering this level of care within the geographic area requested by the patient (or if unable, by the patient's family).  Yes   Patient/family informed of their freedom to choose among providers that offer the needed level of care, that participate in Medicare, Medicaid or managed care program needed by the patient, have an available bed and are willing to accept the patient.  Yes   Patient/family informed of 's ownership interest in [facility] and [facility], as well as of the fact that they are under no obligation to receive care at these facilities.  PASRR submitted to EDS on       PASRR number received on       Existing PASRR number confirmed on       FL2 transmitted to all facilities in geographic area requested by pt/family on 08/01/15     FL2 transmitted to all facilities within larger geographic area on       Patient informed that his/her managed care company has contracts with or will negotiate with certain facilities, including the following:            Patient/family informed of bed offers received.  Patient chooses bed at       Physician recommends and patient chooses bed at      Patient to be transferred to   on  .  Patient to be transferred to facility by       Patient family notified on   of transfer.  Name of family member notified:        PHYSICIAN Please sign FL2     Additional Comment:    _______________________________________________ Glendon Axe, MSW, [place] [phone] 08/01/2015  3:33 PM

## 2015-08-01 NOTE — Progress Notes (Signed)
OT Cancellation Note  Patient Details Name: Evelyn Walker MRN: [id number] DOB: 01-12-1935   Cancelled Treatment:    Reason Eval/Treat Not Completed: Other (comment) Pt is Medicare and current D/C plan is SNF. No apparent immediate acute care OT needs, therefore will defer OT to SNF. If OT eval is needed please call Acute Rehab Dept. at [phone] or text page OT at [phone].    Almon Register [phone] 08/01/2015, 7:23 AM

## 2015-08-01 NOTE — Progress Notes (Signed)
Subjective: 1 Day Post-Op Procedure(s) (LRB): TOTAL KNEE ARTHROPLASTY (Left) Patient reports pain as 4 on 0-10 scale.    Objective: Vital signs in last 24 hours: Temp:  [97.7 F (36.5 C)-98.5 F (36.9 C)] 98.5 F (36.9 C) (10/25 0456) Pulse Rate:  [67-74] 70 (10/25 0456) Resp:  [11-21] 20 (10/25 0456) BP: (95-124)/(51-74) 100/51 mmHg (10/25 0456) SpO2:  [94 %-98 %] 96 % (10/25 0456)  Intake/Output from previous day: 10/24 0701 - 10/25 0700 In: 1360 [P.O.:360; I.V.:1000] Out: 1690 [Urine:1500; Drains:190] Intake/Output this shift: Total I/O In: 240 [P.O.:240] Out: 800 [Urine:800]   Recent Labs  07/31/15 1857 08/01/15 0601  HGB 11.3* 9.3*    Recent Labs  07/31/15 1857 08/01/15 0601  WBC 18.3* 11.6*  RBC 3.63* 3.01*  HCT 34.8* 28.4*  PLT 236 213    Recent Labs  07/31/15 1857 08/01/15 0601  NA  --  135  K  --  4.0  CL  --  103  CO2  --  26  BUN  --  13  CREATININE 0.85 0.62  GLUCOSE  --  139*  CALCIUM  --  8.3*   No results for input(s): LABPT, INR in the last 72 hours.  ABD soft Intact pulses distally Incision: dressing C/D/I patient has no quad activation yet    very weak  Assessment/Plan: 1 Day Post-Op Procedure(s) (LRB): TOTAL KNEE ARTHROPLASTY (Left) Advance diet Up with therapy  Belenda Alviar J 08/01/2015, 1:26 PM

## 2015-08-01 NOTE — Evaluation (Signed)
Occupational Therapy Evaluation Patient Details Name: Evelyn Walker MRN: [id number] DOB: [birth year] Today's Date: 08/01/2015    History of Present Illness 80 y.o. female s/p Lt total knee arthroplasty.   Clinical Impression   Pt reports she was independent with ADLs and mobility PTA. Currently pt is min A overall for ADLs and functional mobility. Educated pt and daughter on compensatory strategies for LB ADLs, edema management techniques, home safety, need for close supervision during ADLs and functional mobility upon return home; pt verbalized understanding. Educated pt on walk in shower transfer technique; pt demonstrated simulated walk in shower transfer. Pt currently planning on d/c to SNF for continued rehab prior to return home; if pt does not d/c to SNF, pt will need HHOT to maximize independence and safety with ADLs and functional mobility. Pt would benefit from continued skilled OT services in order to increase independence and safety with LB ADLs and toilet transfers.     Follow Up Recommendations  SNF;Supervision - Intermittent (if not SNF, needs HHOT)    Equipment Recommendations  None recommended by OT    Recommendations for Other Services       Precautions / Restrictions Precautions Precautions: Knee Required Braces or Orthoses: Knee Immobilizer - Left Knee Immobilizer - Left: On when out of bed or walking;Discontinue once straight leg raise with < 10 degree lag Restrictions Weight Bearing Restrictions: Yes LLE Weight Bearing: Weight bearing as tolerated      Mobility Bed Mobility Overal bed mobility: Needs Assistance Bed Mobility: Sit to Supine     Supine to sit: Min assist Sit to supine: Min assist   General bed mobility comments: Sitting in chair, returned to chair at end of session  Transfers Overall transfer level: Needs assistance Equipment used: Rolling walker (2 wheeled) Transfers: Sit to/from Stand Sit to Stand: Min assist         General  transfer comment: Min A for steadyness and to boost up. Good hand placement and technique. Sit <> stand from chair x 1    Balance Overall balance assessment: Needs assistance Sitting-balance support: Feet supported;No upper extremity supported Sitting balance-Leahy Scale: Good     Standing balance support: Bilateral upper extremity supported Standing balance-Leahy Scale: Poor Standing balance comment: RW for support                            ADL Overall ADL's : Needs assistance/impaired Eating/Feeding: Set up;Sitting           Lower Body Bathing: Minimal assistance;Sit to/from stand       Lower Body Dressing: Minimal assistance;Sit to/from stand   Toilet Transfer: Ambulation;BSC;RW;Minimal assistance (BSC over toilet)       Tub/ Shower Transfer: Minimal assistance;Ambulation;3 in 1;Rolling walker;Walk-in Lobbyist Details (indicate cue type and reason): Simulated walk in shower transfer Functional mobility during ADLs: Minimal assistance;Rolling walker General ADL Comments: Daughter present for OT session. Educated pt and daughter on compensatory strategies for LB ADLs, edema management techniques, home safety, need for close supervision during ADLs and functional mobility; pt verbalized understanding. Educated pt on walk in shower transfer technique; pt demonstrated understanding. Discussed use of AE for increased independence with LB ADLs; pt reports she will rely on husband for assist as needed     Vision     Perception     Praxis      Pertinent Vitals/Pain Pain Assessment: Faces Faces Pain Scale: Hurts even more Pain Location: L knee Pain  Descriptors / Indicators: Aching Pain Intervention(s): Limited activity within patient's tolerance;Monitored during session;Repositioned;RN gave pain meds during session;Ice applied     Hand Dominance Right   Extremity/Trunk Assessment Upper Extremity Assessment Upper Extremity Assessment:  Generalized weakness   Lower Extremity Assessment Lower Extremity Assessment: Defer to PT evaluation       Communication Communication Communication: No difficulties   Cognition Arousal/Alertness: Awake/alert Behavior During Therapy: WFL for tasks assessed/performed Overall Cognitive Status: Within Functional Limits for tasks assessed                     General Comments       Exercises Exercises: Total Joint     Shoulder Instructions      Home Living Family/patient expects to be discharged to:: Skilled nursing facility Living Arrangements: Spouse/significant other Available Help at Discharge: Family;Available 24 hours/day Type of Home: House Home Access: Stairs to enter [employer] of Steps: 7 Entrance Stairs-Rails: Right;Left Home Layout: Two level;Bed/bath upstairs Alternate Level Stairs-Number of Steps: 14 Alternate Level Stairs-Rails: Right;Left Bathroom Shower/Tub: Walk-in shower;Curtain         Home Equipment: Shower seat - built in;Other (comment);Walker - 2 wheels;Bedside commode (very low shower seat)          Prior Functioning/Environment Level of Independence: Independent             OT Diagnosis: Generalized weakness;Acute pain   OT Problem List: Decreased strength;Decreased activity tolerance;Impaired balance (sitting and/or standing);Decreased safety awareness;Decreased knowledge of use of DME or AE;Decreased knowledge of precautions;Pain   OT Treatment/Interventions: Self-care/ADL training;DME and/or AE instruction;Patient/family education    OT Goals(Current goals can be found in the care plan section) Acute Rehab OT Goals Patient Stated Goal: Get well before going home OT Goal Formulation: With patient Time For Goal Achievement: 08/15/15 Potential to Achieve Goals: Good ADL Goals Pt Will Perform Grooming: with supervision;standing Pt Will Perform Lower Body Bathing: with supervision;sit to/from stand (with or  without AE) Pt Will Perform Lower Body Dressing: with supervision;sit to/from stand (with or without AE) Pt Will Transfer to Toilet: with supervision;ambulating;bedside commode (over toilet) Pt Will Perform Toileting - Clothing Manipulation and hygiene: with supervision;sit to/from stand  OT Frequency: Min 2X/week   Barriers to D/C: Inaccessible home environment          Co-evaluation              End of Session Equipment Utilized During Treatment: Rolling walker;Left knee immobilizer CPM Left Knee CPM Left Knee: Off  Activity Tolerance: Patient tolerated treatment well Patient left: in chair;with call bell/phone within reach;with family/visitor present   Time: 2725-3664 OT Time Calculation (min): 19 min Charges:  OT General Charges $OT Visit: 1 Procedure OT Evaluation $Initial OT Evaluation Tier I: 1 Procedure G-Codes:     Binnie Kand M.S., OTR/L Pager: [phone]  08/01/2015, 3:08 PM

## 2015-08-01 NOTE — Clinical Social Work Note (Signed)
Clinical Social Work Assessment  Patient Details  Name: Evelyn Walker MRN: [id number] Date of Birth: Feb 25, 1935  Date of referral:  08/01/15               Reason for consult:  Facility Placement                Permission sought to share information with:  Family Supports Permission granted to share information::  Yes, Verbal Permission Granted  Name::      Shelda Pal, husband)  Agency::   (SNF)  Relationship::   (husband, Shelda Pal)  Contact Information:   Megha Agnes- [phone])  Housing/Transportation Living arrangements for the past 2 months:  [place] of Information:  Patient Patient Interpreter Needed:  None Criminal Activity/Legal Involvement Pertinent to Current Situation/Hospitalization:  No - Comment as needed Significant Relationships:  Significant Other Lives with:  Significant Other Do you feel safe going back to the place where you live?  No Need for family participation in patient care:  Yes (Comment)  Care giving concerns:  Patient did not express any care giving concerns at this time.    Social Worker assessment / plan:   BSW intern met with patient at bedside to discuss discharge disposition. BSW intern presented a SNF list for patient to review. Patient informed BSW intern that she has recently met with Joseph Art' at [facility] to begin her registration for placement. BSW intern has contacted [facility] to confirm that patient has a bed at their facility upon discharge. BSW intern notified patient that her clinicals were to be faxed to [facility].  BSW intern has sent patient's clinicals to requested facility.  Patient was very polite and understanding during BSW intern's assessment. Patient was hopeful that she will be discharging soon. BSW intern to contact patient's husband, Meda Coffee in reference to patient's discharge plan upon patient's request. Patient to be transported to [facility] once medically stable. BSW intern remains available  if any further Social Work needs arises.  Employment status:  Retired Forensic scientist:  Medicare PT Recommendations:  [place] / Referral to community resources:  [place]  Patient/Family's Response to care:   Patient seemed very content. Patient briefly expressed to [facility] intern that she was experiencing little pain during assessment.   Patient/Family's Understanding of and Emotional Response to Diagnosis, Current Treatment, and Prognosis:   Patient is aware of need for continud short-term rehab.  Emotional Assessment Appearance:  Appears stated age Attitude/Demeanor/Rapport:   (calm, polite, friendly) Affect (typically observed):  Accepting, Appropriate, Calm, Pleasant Orientation:  Oriented to Place, Oriented to Self, Oriented to  Time, Oriented to Situation Alcohol / Substance use:  Not Applicable Psych involvement (Current and /or in the community):  No (Comment)  Discharge Needs  Concerns to be addressed:  No discharge needs identified Readmission within the last 30 days:  No Current discharge risk:  None Barriers to Discharge:  No Barriers Identified   Leane Call, Student-SW 08/01/2015, 2:45 PM

## 2015-08-01 NOTE — Progress Notes (Signed)
Physical Therapy Treatment Patient Details Name: LAQUIESHA PIACENTE MRN: [id number] DOB: July 15, 1935 Today's Date: 08/01/2015    History of Present Illness 80 y.o. female s/p Lt total knee arthroplasty.   Past Medical History  Diagnosis Date  . Hypothyroidism   . GERD (gastroesophageal reflux disease)   . Arthritis   . Essential hypertension   . Rhinitis   . Primary localized osteoarthritis of left knee   . PONV (postoperative nausea and vomiting)     ? whether morphine or anesthesia   Past Surgical History  Procedure Laterality Date  . Total knee arthroplasty Right 2004  . Colonoscopy N/A 07/27/2014    Procedure: COLONOSCOPY;  Surgeon: Rogene Houston, MD;  [facility];  Service: Endoscopy;  Laterality: N/A;  100  . Cataract extraction Bilateral   . Total knee arthroplasty Left 07/31/2015    Procedure: TOTAL KNEE ARTHROPLASTY;  Surgeon: Elsie Saas, MD;  Location: [facility];  Service: Orthopedics;  Laterality: Left;        PT Comments    Continued quad weakness, but able to activate and control knee moderately against gravity; used KI for better knee stability with amb;   Noting plans throughout the chart are for dc to SNF for post-acute rehab, and this is of course a solid, viable option;   Still, it IS possible that pt may progress well enough to dc home with 24 hour assist (perhaps with bed on first floor, though it is not unreasonable to predict that she will be able to ascend a flight of steps);   Will monitor progress and update dc plan as necessary    Follow Up Recommendations  Home health PT;SNF (See above comments)     Equipment Recommendations  None recommended by PT    Recommendations for Other Services       Precautions / Restrictions Precautions Precautions: Knee Required Braces or Orthoses: Knee Immobilizer - Left Knee Immobilizer - Left: On when out of bed or walking;Discontinue once straight leg raise with < 10 degree lag Restrictions LLE  Weight Bearing: Weight bearing as tolerated    Mobility  Bed Mobility Overal bed mobility: Needs Assistance Bed Mobility: Sit to Supine     Supine to sit: Min assist Sit to supine: Min assist   General bed mobility comments: Cues for technique Min assist for LLE  Transfers Overall transfer level: Needs assistance Equipment used: Rolling walker (2 wheeled) Transfers: Sit to/from Stand Sit to Stand: Min assist         General transfer comment: Cues for technique and min assist for steadiness  Ambulation/Gait Ambulation/Gait assistance: +2 safety/equipment;Mod assist Ambulation Distance (Feet): 70 Feet Assistive device: Rolling walker (2 wheeled) Gait Pattern/deviations: Step-to pattern;Step-through pattern;Trunk flexed (emerging step-through)     General Gait Details: Cues to activate L quad for L stance stability; second person pushing chair behind for safety and to boost confidence to incr amb; 3-5 moments of L knee buckling; pt able to steady self with RW; mod assist for RW progression   Stairs            Wheelchair Mobility    Modified Rankin (Stroke Patients Only)       Balance             Standing balance-Leahy Scale: Poor                      Cognition Arousal/Alertness: Awake/alert Behavior During Therapy: WFL for tasks assessed/performed Overall Cognitive Status: Within  Functional Limits for tasks assessed                      Exercises Total Joint Exercises Quad Sets: AROM;Strengthening;Left;10 reps Heel Slides: AAROM;Left;5 reps (limited by pain) Straight Leg Raises: AAROM;Left;10 reps Goniometric ROM: 8-70 approx    General Comments        Pertinent Vitals/Pain Pain Assessment: Faces Faces Pain Scale: Hurts little more Pain Location: L knee Pain Descriptors / Indicators: Aching Pain Intervention(s): Limited activity within patient's tolerance;Premedicated before session    Home Living                       Prior Function            PT Goals (current goals can now be found in the care plan section) Acute Rehab PT Goals Patient Stated Goal: Get well before going home PT Goal Formulation: With patient/family Time For Goal Achievement: 08/07/15 Potential to Achieve Goals: Good Progress towards PT goals: Progressing toward goals    Frequency  7X/week    PT Plan Discharge plan needs to be updated    Co-evaluation             End of Session Equipment Utilized During Treatment: Gait belt Activity Tolerance: Patient tolerated treatment well Patient left: in bed;in CPM;with call bell/phone within reach     Time: 5993-5701 PT Time Calculation (min) (ACUTE ONLY): 31 min  Charges:  $Gait Training: 23-37 mins                    G Codes:      Quin Hoop 08/01/2015, 2:35 PM  Roney Marion, [place] Pager [phone] Office [phone]

## 2015-08-02 LAB — CBC
HEMATOCRIT: 27.5 % — AB (ref 36.0–46.0)
HEMOGLOBIN: 8.9 g/dL — AB (ref 12.0–15.0)
MCH: 30.9 pg (ref 26.0–34.0)
MCHC: 32.4 g/dL (ref 30.0–36.0)
MCV: 95.5 fL (ref 78.0–100.0)
Platelets: 192 10*3/uL (ref 150–400)
RBC: 2.88 MIL/uL — AB (ref 3.87–5.11)
RDW: 15 % (ref 11.5–15.5)
WBC: 9.2 10*3/uL (ref 4.0–10.5)

## 2015-08-02 LAB — BASIC METABOLIC PANEL
ANION GAP: 12 (ref 5–15)
BUN: 16 mg/dL (ref 6–20)
CALCIUM: 8.9 mg/dL (ref 8.9–10.3)
CHLORIDE: 104 mmol/L (ref 101–111)
CO2: 24 mmol/L (ref 22–32)
Creatinine, Ser: 0.7 mg/dL (ref 0.44–1.00)
GFR calc non Af Amer: >60 mL/min (ref >60)
GLUCOSE: 120 mg/dL — AB (ref 65–99)
POTASSIUM: 4 mmol/L (ref 3.5–5.1)
Sodium: 140 mmol/L (ref 135–145)

## 2015-08-02 NOTE — Progress Notes (Signed)
Subjective: 2 Days Post-Op Procedure(s) (LRB): TOTAL KNEE ARTHROPLASTY (Left) Patient reports pain as 3 on 0-10 scale.    Objective: Vital signs in last 24 hours: Temp:  [98 F (36.7 C)] 98 F (36.7 C) (10/26 0523) Pulse Rate:  [68-81] 75 (10/26 0523) Resp:  [18-20] 18 (10/26 0523) BP: (92-123)/(57-74) 107/68 mmHg (10/26 0523) SpO2:  [91 %-96 %] 91 % (10/26 0523)  Intake/Output from previous day: 10/25 0701 - 10/26 0700 In: 960 [P.O.:960] Out: 800 [Urine:800] Intake/Output this shift:     Recent Labs  07/31/15 1857 08/01/15 0601 08/02/15 0310  HGB 11.3* 9.3* 8.9*    Recent Labs  08/01/15 0601 08/02/15 0310  WBC 11.6* 9.2  RBC 3.01* 2.88*  HCT 28.4* 27.5*  PLT 213 192    Recent Labs  08/01/15 0601 08/02/15 0310  NA 135 140  K 4.0 4.0  CL 103 104  CO2 26 24  BUN 13 16  CREATININE 0.62 0.70  GLUCOSE 139* 120*  CALCIUM 8.3* 8.9   No results for input(s): LABPT, INR in the last 72 hours.  ABD soft Neurovascular intact Sensation intact distally Dorsiflexion/Plantar flexion intact Incision: dressing C/D/I much less quad weakness today   no buckling with ambulation  Assessment/Plan: 2 Days Post-Op Procedure(s) (LRB): TOTAL KNEE ARTHROPLASTY (Left)  Principal Problem:   Primary localized osteoarthritis of left knee Active Problems:   Hypothyroidism   Essential hypertension   DJD (degenerative joint disease) of knee  Advance diet Up with therapy Plan for discharge tomorrow patient still interested in [facility] but is progressing much better today than yesterday.  May be able to go home tomorrow instead of [facility] but will need to monitor progress with physical therapy.  She is ambulating much better today with no knee buckling.  Braycen Burandt J 08/02/2015, 7:34 AM

## 2015-08-02 NOTE — Progress Notes (Signed)
Physical Therapy Treatment Patient Details Name: ROYANN WILDASIN MRN: [id number] DOB: December 20, 1934 Today's Date: 08/02/2015    History of Present Illness 80 y.o. female s/p Lt total knee arthroplasty.    PT Comments    Continuing progress with less need for bracing to stabilize L knee in stance  Follow Up Recommendations  SNF     Equipment Recommendations  None recommended by PT    Recommendations for Other Services       Precautions / Restrictions Precautions Precautions: Knee Restrictions LLE Weight Bearing: Weight bearing as tolerated    Mobility  Bed Mobility Overal bed mobility: Needs Assistance Bed Mobility: Supine to Sit     Supine to sit: Min guard Sit to supine: Min guard   General bed mobility comments: Cues for technique Min assist for LLE  Transfers Overall transfer level: Needs assistance Equipment used: Rolling walker (2 wheeled) Transfers: Sit to/from Stand Sit to Stand: Min guard         General transfer comment: Good hand placement and rise; cues for technique  Ambulation/Gait Ambulation/Gait assistance: Min guard Ambulation Distance (Feet): 110 Feet Assistive device: Rolling walker (2 wheeled) Gait Pattern/deviations: Step-through pattern     General Gait Details: Cues to activate L quad for L stance stability; Proceeded with amb without KI after testign for stance stability; much mroe stable knee in stance   Stairs            Wheelchair Mobility    Modified Rankin (Stroke Patients Only)       Balance                                    Cognition Arousal/Alertness: Awake/alert Behavior During Therapy: WFL for tasks assessed/performed Overall Cognitive Status: Within Functional Limits for tasks assessed                      Exercises Total Joint Exercises Ankle Circles/Pumps: AROM;Both;10 reps;Seated Quad Sets: AROM;Strengthening;Left;10 reps Short Arc Quad: AROM;Left;10 reps Heel Slides:  AAROM;Left;10 reps Hip ABduction/ADduction: AROM;Left;10 reps Straight Leg Raises: AROM;Left;10 reps Goniometric ROM: 5-85    General Comments        Pertinent Vitals/Pain Pain Assessment: 0-10 Pain Score: 4  Pain Location: L knee Pain Descriptors / Indicators: Aching Pain Intervention(s): Monitored during session    Home Living                      Prior Function            PT Goals (current goals can now be found in the care plan section) Acute Rehab PT Goals Patient Stated Goal: Get well before going home PT Goal Formulation: With patient/family Time For Goal Achievement: 08/07/15 Potential to Achieve Goals: Good Progress towards PT goals: Progressing toward goals    Frequency  7X/week    PT Plan Current plan remains appropriate    Co-evaluation             End of Session Equipment Utilized During Treatment: Gait belt Activity Tolerance: Patient tolerated treatment well Patient left: in bed;in CPM;with call bell/phone within reach;with family/visitor present     Time: 1406-1430 PT Time Calculation (min) (ACUTE ONLY): 24 min  Charges:  $Gait Training: 8-22 mins $Therapeutic Exercise: 8-22 mins                    G Codes:  Roney Marion Hamff 08/02/2015, 4:15 PM  Roney Marion, [place] Pager [phone] Office [phone]

## 2015-08-02 NOTE — Clinical Social Work Note (Signed)
CSW spoke with patient's husband who was informed that a bed is available for patient at [facility].  Patient's husband was informed that she may be ready for discharge on Thursday as long as she is medically ready for discharge and orders have been received.  CSW to continue to follow patient's progress.  Jones Broom. [place], MSW, [place] 08/02/2015 6:17 PM

## 2015-08-02 NOTE — Progress Notes (Signed)
Physical Therapy Treatment Patient Details Name: Evelyn Walker MRN: [id number] DOB: 1935/05/12 Today's Date: 08/02/2015    History of Present Illness 80 y.o. female s/p Lt total knee arthroplasty.    PT Comments    Much improved knee stability in stance today, with improving activity tolerance as well; Lengthy discussion with Evelyn Walker and [place], Case Mgr, re: dc planning, and ultimately Evelyn Walker is expressing significant anxiety at the notion on going home tomorrow -- enough to where we have concerns about her safely managing those sairs at home, given her anxiety (see stair comments)  Overall progressing well; Anticipate continuing good progress at post-acute rehabilitation.   Follow Up Recommendations  SNF     Equipment Recommendations  None recommended by PT    Recommendations for Other Services       Precautions / Restrictions Precautions Precautions: Knee Precaution Comments: reviewed precautions Required Braces or Orthoses: Knee Immobilizer - Left Knee Immobilizer - Left: On when out of bed or walking;Discontinue once straight leg raise with < 10 degree lag Restrictions Weight Bearing Restrictions: Yes LLE Weight Bearing: Weight bearing as tolerated    Mobility  Bed Mobility                  Transfers Overall transfer level: Needs assistance Equipment used: Rolling walker (2 wheeled) Transfers: Sit to/from Stand Sit to Stand: Min guard (without physical assist)         General transfer comment: Good hand placement and rise; cues for technique  Ambulation/Gait Ambulation/Gait assistance: Min guard Ambulation Distance (Feet): 100 Feet (x2) Assistive device: Rolling walker (2 wheeled) Gait Pattern/deviations: Step-through pattern     General Gait Details: Cues to activate L quad for L stance stability; second person pushing chair behind for safety and to boost confidence to incr amb; much mroe stable knee in stance   Stairs Stairs: Yes Stairs  assistance: Min guard Stair Management: One rail Right;One rail Left;Sideways;Step to pattern Number of Stairs: 10 (10 total; 3x2 (R rail, then L rail); 2x2 same technqiue, R rail, then L rail) General stair comments: Cues for sequence and technique; overall managed well, no L knee buckle; at one point, Evelyn Walker became distracted and anxious and lead with R LE descending, causing pain and needing assist for balance, but pain subsided quickly  Wheelchair Mobility    Modified Rankin (Stroke Patients Only)       Balance             Standing balance-Leahy Scale: Fair                      Cognition Arousal/Alertness: Awake/alert Behavior During Therapy: WFL for tasks assessed/performed Overall Cognitive Status: Within Functional Limits for tasks assessed                      Exercises      General Comments        Pertinent Vitals/Pain Pain Assessment: 0-10 Pain Score: 5  Pain Location: L knee Pain Descriptors / Indicators: Aching Pain Intervention(s): Monitored during session;Premedicated before session    Home Living                      Prior Function            PT Goals (current goals can now be found in the care plan section) Acute Rehab PT Goals Patient Stated Goal: Get well before going home PT Goal Formulation: With  patient/family Time For Goal Achievement: 08/07/15 Potential to Achieve Goals: Good Progress towards PT goals: Progressing toward goals    Frequency  7X/week    PT Plan Discharge plan needs to be updated    Co-evaluation             End of Session Equipment Utilized During Treatment: Gait belt;Left knee immobilizer Activity Tolerance: Patient tolerated treatment well Patient left: in chair;with call bell/phone within reach     Time: 1025-1111 PT Time Calculation (min) (ACUTE ONLY): 46 min  Charges:  $Gait Training: 23-37 mins $Therapeutic Activity: 8-22 mins                    G Codes:       Quin Hoop 08/02/2015, 11:28 AM  Roney Marion, [place] Pager [phone] Office [phone]

## 2015-08-03 LAB — BASIC METABOLIC PANEL
ANION GAP: 4 — AB (ref 5–15)
BUN: 14 mg/dL (ref 6–20)
CALCIUM: 8.1 mg/dL — AB (ref 8.9–10.3)
CO2: 28 mmol/L (ref 22–32)
CREATININE: 0.63 mg/dL (ref 0.44–1.00)
Chloride: 103 mmol/L (ref 101–111)
Glucose, Bld: 101 mg/dL — ABNORMAL HIGH (ref 65–99)
Potassium: 3.6 mmol/L (ref 3.5–5.1)
Sodium: 135 mmol/L (ref 135–145)

## 2015-08-03 LAB — CBC
HEMATOCRIT: 25 % — AB (ref 36.0–46.0)
Hemoglobin: 8.3 g/dL — ABNORMAL LOW (ref 12.0–15.0)
MCH: 31.6 pg (ref 26.0–34.0)
MCHC: 33.2 g/dL (ref 30.0–36.0)
MCV: 95.1 fL (ref 78.0–100.0)
PLATELETS: 180 10*3/uL (ref 150–400)
RBC: 2.63 MIL/uL — ABNORMAL LOW (ref 3.87–5.11)
RDW: 14.9 % (ref 11.5–15.5)
WBC: 9.7 10*3/uL (ref 4.0–10.5)

## 2015-08-03 MED ORDER — OXYCODONE HCL 5 MG PO TABS: ORAL_TABLET | ORAL | Status: DC

## 2015-08-03 MED ORDER — CELECOXIB 200 MG PO CAPS: ORAL_CAPSULE | ORAL | Status: DC

## 2015-08-03 MED ORDER — ZOLPIDEM TARTRATE 5 MG PO TABS: 5.0000 mg | ORAL_TABLET | Freq: Every evening | ORAL | Status: DC | PRN

## 2015-08-03 MED ORDER — ASPIRIN EC 325 MG PO TBEC: DELAYED_RELEASE_TABLET | ORAL | Status: DC

## 2015-08-03 NOTE — Progress Notes (Signed)
Pt ready for d/c per MD. Cleared by PT/OT for d/c home w/ hh PT. Has walker and 3N1 at home, cane delivered to room. Discharge teaching and prescriptions given to pt and husband at bedside, all questions answered.   Raquel James  08/03/2015 4:19 PM

## 2015-08-03 NOTE — Progress Notes (Signed)
Physical Therapy Treatment Patient Details Name: Evelyn Walker MRN: [id number] DOB: 09/11/1935 Today's Date: 08/03/2015    History of Present Illness 80 y.o. female s/p Lt total knee arthroplasty.    PT Comments    Excellent progress. Pt requiring supervision to min guard assist for all functional mobility, including stairs. D/C plan updated for home with HHPT. Pt will need a cane for home use. Plan is for d/c home today.  Follow Up Recommendations  Home health PT;Supervision - Intermittent     Equipment Recommendations  Cane    Recommendations for Other Services       Precautions / Restrictions Precautions Precautions: Knee Required Braces or Orthoses:  (KI discontinued) Restrictions Weight Bearing Restrictions: Yes LLE Weight Bearing: Weight bearing as tolerated    Mobility  Bed Mobility               General bed mobility comments: Pt in recliner upon arrival and returned to recliner after ambulation.  Transfers   Equipment used: Rolling walker (2 wheeled)   Sit to Stand: Supervision Stand pivot transfers: Supervision       General transfer comment: Pt demo correct sequencing.  Ambulation/Gait Ambulation/Gait assistance: Supervision Ambulation Distance (Feet): 200 Feet Assistive device: Rolling walker (2 wheeled) Gait Pattern/deviations: Step-through pattern;Decreased stride length Gait velocity: decreased       Stairs   Stairs assistance: Min guard Stair Management: One rail Right;Sideways Number of Stairs: 10 General stair comments: Good technique. No verbal cues needed for sequencing.  Wheelchair Mobility    Modified Rankin (Stroke Patients Only)       Balance                                    Cognition Arousal/Alertness: Awake/alert Behavior During Therapy: WFL for tasks assessed/performed Overall Cognitive Status: Within Functional Limits for tasks assessed                      Exercises Total Joint  Exercises Ankle Circles/Pumps: AROM;Both;10 reps Quad Sets: AROM;Both;10 reps Short Arc Quad: AROM;Left;10 reps Heel Slides: AROM;Left;10 reps Hip ABduction/ADduction: AROM;Left;10 reps Goniometric ROM: 5-85 L knee    General Comments        Pertinent Vitals/Pain Pain Assessment: 0-10 Pain Score: 5  Pain Location: L knee Pain Descriptors / Indicators: Sore Pain Intervention(s): Monitored during session;Premedicated before session    Home Living                      Prior Function            PT Goals (current goals can now be found in the care plan section) Acute Rehab PT Goals Patient Stated Goal: go home today PT Goal Formulation: With patient/family Time For Goal Achievement: 08/07/15 Potential to Achieve Goals: Good Progress towards PT goals: Progressing toward goals    Frequency  7X/week    PT Plan Discharge plan needs to be updated    Co-evaluation             End of Session Equipment Utilized During Treatment: Gait belt Activity Tolerance: Patient tolerated treatment well Patient left: in chair;with call bell/phone within reach     Time: 2536-6440 PT Time Calculation (min) (ACUTE ONLY): 25 min  Charges:  $Gait Training: 8-22 mins $Therapeutic Exercise: 8-22 mins  G Codes:      Evelyn Walker 08/03/2015, 10:38 AM

## 2015-08-03 NOTE — Care Management Important Message (Signed)
Important Message  Patient Details  Name: Evelyn Walker MRN: [id number] Date of Birth: 04/28/1935   Medicare Important Message Given:  Yes-second notification given    Nathen May 08/03/2015, 10:41 AM

## 2015-08-03 NOTE — Progress Notes (Signed)
Checked in with pt regarding any questions or concerns for OT prior to d/c home. Pt with no questions or concerns at this time. Pt feels comfortable with d/c home from a functional standpoint. Will continue to follow pt acutely.    Truman Hayward, M.S., OTR/L Pager: [phone]  08/03/15, 1:47 PM

## 2015-08-03 NOTE — Care Management Note (Signed)
Case Management Note  Patient Details  Name: Evelyn Walker MRN: [id number] Date of Birth: 08-18-1935  Subjective/Objective:     80 yr old female s/p left total knee arthroplasty.               Action/Plan:  Patient was initially scheduled to go for rehab at [facility], has progressed well with therapy and will be able to discharge home with Home Health. Patient was preoperatively setup with [facility], no changes. Has family support at discharge.  Expected Discharge Date:  08/03/15 Expected Discharge Plan:     In-House Referral:     Discharge planning Services  CM Consult  Post Acute Care Choice:  Home Health Choice offered to:  Patient  DME Arranged:  Gilford Rile rolling, CPM DME Agency:     HH Arranged:  PT HH Agency:  [facility]  Status of Service:  Completed, signed off  Medicare Important Message Given:  Yes-second notification given Date Medicare IM Given:    Medicare IM give by:    Date Additional Medicare IM Given:    Additional Medicare Important Message give by:     If discussed at [place] of Stay Meetings, dates discussed:    Additional Comments:  Ninfa Meeker, RN 08/03/2015, 3:39 PM

## 2015-08-03 NOTE — Clinical Social Work Note (Addendum)
CSW received referral for SNF.  Case discussed with case manager, and plan is to discharge home CSW informed Evelyn Walker that patient is now going home with home health.  CSW to sign off please re-consult if social work needs arise.  Jones Broom. [place], MSW, [place]

## 2015-08-03 NOTE — Progress Notes (Signed)
Physical Therapy Treatment Patient Details Name: Evelyn Walker MRN: [id number] DOB: 1935/01/22 Today's Date: 08/03/2015    History of Present Illness 80 y.o. female s/p Lt total knee arthroplasty on 07/31/15.  Pt with significant PMHx of essential HTN, hypothyriodism, and R TKA (2004).    PT Comments    Pt is progressing well with mobility.  Husband present to witness stair training and they are both confident she will be able to safely get upstairs in their home at discharge.  PT will continue to follow acutely until d/c confirmed.   Follow Up Recommendations  Home health PT;Supervision - Intermittent     Equipment Recommendations  Cane    Recommendations for Other Services   NA     Precautions / Restrictions Precautions Precautions: Knee Restrictions LLE Weight Bearing: Weight bearing as tolerated    Mobility                 Transfers Overall transfer level: Needs assistance Equipment used: Rolling walker (2 wheeled) Transfers: Sit to/from Stand Sit to Stand: Supervision         General transfer comment: supervision for safety due to heavy reliance on arms for support during transitions.  Ambulation/Gait Ambulation/Gait assistance: Supervision Ambulation Distance (Feet): 10 Feet Assistive device: Rolling walker (2 wheeled) Gait Pattern/deviations: Step-to pattern;Antalgic     General Gait Details: moderately antalgic gait pattern, pt stiff from being in the chair in the knee extension foam for a while.    Stairs Stairs: Yes Stairs assistance: Supervision Stair Management: One rail Right;Sideways Number of Stairs: 10 General stair comments: Pt was able to demonstrate good technique sideways even leading up with the good and leading down with the good, she was able to compensate well.         Balance Overall balance assessment: Needs assistance Sitting-balance support: Feet supported;No upper extremity supported Sitting balance-Leahy Scale:  Good     Standing balance support: Bilateral upper extremity supported;Single extremity supported;No upper extremity supported Standing balance-Leahy Scale: Fair                      Cognition Arousal/Alertness: Awake/alert Behavior During Therapy: WFL for tasks assessed/performed Overall Cognitive Status: Within Functional Limits for tasks assessed                         General Comments General comments (skin integrity, edema, etc.): Pt and husband seemed confident of her ability and are ready for d/c home.       Pertinent Vitals/Pain Pain Assessment: 0-10 Pain Score: 4  Pain Location: left knee Pain Descriptors / Indicators: Aching;Burning Pain Intervention(s): Limited activity within patient's tolerance;Monitored during session;Repositioned           PT Goals (current goals can now be found in the care plan section) Acute Rehab PT Goals Patient Stated Goal: go home today Progress towards PT goals: Progressing toward goals    Frequency  7X/week    PT Plan Discharge plan needs to be updated       End of Session Equipment Utilized During Treatment: Gait belt Activity Tolerance: Patient limited by pain Patient left: in chair;with call bell/phone within reach;with family/visitor present     Time: 6568-1275 PT Time Calculation (min) (ACUTE ONLY): 12 min  Charges:  $Gait Training: 8-22 mins                      Vick Filter B. [place], [place], DPT [phone]  08/03/2015, 4:45 PM

## 2015-08-03 NOTE — Discharge Summary (Signed)
Patient ID: Evelyn Walker MRN: [id number] DOB/AGE: 05-06-1935 80 y.o.  Admit date: 07/31/2015 Discharge date: 08/03/2015  Admission Diagnoses:  Principal Problem:   Primary localized osteoarthritis of left knee Active Problems:   Hypothyroidism   Essential hypertension   DJD (degenerative joint disease) of knee   Discharge Diagnoses:  Same  Past Medical History  Diagnosis Date  . Hypothyroidism   . GERD (gastroesophageal reflux disease)   . Arthritis   . Essential hypertension   . Rhinitis   . Primary localized osteoarthritis of left knee   . PONV (postoperative nausea and vomiting)     ? whether morphine or anesthesia    Surgeries: Procedure(s): TOTAL KNEE ARTHROPLASTY on 07/31/2015   Consultants:    Discharged Condition: Improved  Hospital Course: JALACIA MATTILA is an 80 y.o. female who was admitted 07/31/2015 for operative treatment ofPrimary localized osteoarthritis of left knee. Patient has severe unremitting pain that affects sleep, daily activities, and work/hobbies. After pre-op clearance the patient was taken to the operating room on 07/31/2015 and underwent  Procedure(s): TOTAL KNEE ARTHROPLASTY.    Patient was given perioperative antibiotics:      Anti-infectives    Start     Dose/Rate Route Frequency Ordered Stop   07/31/15 1700  ceFAZolin (ANCEF) IVPB 2 g/50 mL premix     2 g 100 mL/hr over 30 Minutes Intravenous Every 6 hours 07/31/15 1258 08/01/15 0459   07/31/15 1158  tobramycin (NEBCIN) powder  Status:  Discontinued       As needed 07/31/15 1158 07/31/15 1239   07/31/15 0933  vancomycin (VANCOCIN) 1 GM/200ML IVPB    Comments:  Wandalee Ferdinand   : cabinet override      07/31/15 0933 07/31/15 2144   07/31/15 0600  ceFAZolin (ANCEF) IVPB 2 g/50 mL premix     2 g 100 mL/hr over 30 Minutes Intravenous On call to O.R. 07/30/15 1500 07/31/15 1059       Patient was given sequential compression devices, early ambulation, and chemoprophylaxis to  prevent DVT.  Patient benefited maximally from hospital stay and there were no complications.    Recent vital signs:  Patient Vitals for the past 24 hrs:  BP Temp Temp src Pulse Resp SpO2  08/03/15 0545 113/68 mmHg 98.2 F (36.8 C) Oral 72 16 97 %  08/02/15 2323 124/68 mmHg 98.3 F (36.8 C) Oral 77 16 96 %     Recent laboratory studies:   Recent Labs  08/02/15 0310 08/03/15 0500  WBC 9.2 9.7  HGB 8.9* 8.3*  HCT 27.5* 25.0*  PLT 192 180  NA 140 135  K 4.0 3.6  CL 104 103  CO2 24 28  BUN 16 14  CREATININE 0.70 0.63  GLUCOSE 120* 101*  CALCIUM 8.9 8.1*     Discharge Medications:     Medication List    STOP taking these medications        ALEVE 220 MG tablet  Generic drug:  naproxen sodium     Co Q-10 100 MG Caps     Glucosamine 500 MG Caps     HAIR/SKIN/NAILS/BIOTIN Tabs     ICAPS Caps     OVER THE COUNTER MEDICATION     PRESCRIPTION MEDICATION      TAKE these medications        amoxicillin 500 MG capsule  Commonly known as:  AMOXIL  Take 500 mg by mouth See admin instructions. Take 4 capsules (2000 mg) by mouth one hour prior  to dental appointments     aspirin EC 325 MG tablet  1 tab a day for the next 30 days to prevent blood clots     celecoxib 200 MG capsule  Commonly known as:  CELEBREX  1 tab po q day with food for pain and  swelling     cetirizine 10 MG tablet  Commonly known as:  ZYRTEC  Take 10 mg by mouth daily.     DIGESTIVE ADVANTAGE GUMMIES Chew  Chew 1 tablet by mouth daily.     FIBER CHOICE PO  Take 1 tablet by mouth daily.     fluticasone 50 MCG/ACT nasal spray  Commonly known as:  FLONASE  Place 2 sprays into both nostrils at bedtime.     levothyroxine 137 MCG tablet  Commonly known as:  SYNTHROID, LEVOTHROID  Take 137 mcg by mouth daily before breakfast.     MAGNESIUM PO  Take 1 tablet by mouth at bedtime.     MEGARED OMEGA-3 KRILL OIL PO  Take 350 mg by mouth daily.     omeprazole 40 MG capsule  Commonly  known as:  PRILOSEC  Take 40 mg by mouth daily before breakfast.     oxyCODONE 5 MG immediate release tablet  Commonly known as:  Oxy IR/ROXICODONE  1-2 tablets every 4-6 hrs as needed for pain     SYSTANE ULTRA OP  Place 1 drop into both eyes at bedtime.     Vitamin D-3 5000 UNITS Tabs  Take 5,000 Units by mouth daily.     zolpidem 5 MG tablet  Commonly known as:  AMBIEN  Take 1 tablet (5 mg total) by mouth at bedtime as needed for sleep.        Diagnostic Studies: No results found.  Disposition: 01-Home or Self Care  Discharge Instructions    CPM    Complete by:  As directed   Continuous passive motion machine (CPM):      Use the CPM from 0 to 90 for 6 hours per day.       You may break it up into 2 or 3 sessions per day.      Use CPM for 2 weeks or until you are told to stop.     Call MD / Call 911    Complete by:  As directed   If you experience chest pain or shortness of breath, CALL 911 and be transported to the hospital emergency room.  If you develope a fever above 101 F, pus (white drainage) or increased drainage or redness at the wound, or calf pain, call your surgeon's office.     Change dressing    Complete by:  As directed   Change the gauze dressing daily with sterile 4 x 4 inch gauze and apply TED hose.  DO NOT REMOVE BANDAGE OVER SURGICAL INCISION.  [place] WHOLE LEG INCLUDING OVER THE WATERPROOF BANDAGE WITH SOAP AND WATER EVERY DAY.     Constipation Prevention    Complete by:  As directed   Drink plenty of fluids.  Prune juice may be helpful.  You may use a stool softener, such as Colace (over the counter) 100 mg twice a day.  Use MiraLax (over the counter) for constipation as needed.     Diet - low sodium heart healthy    Complete by:  As directed      Discharge instructions    Complete by:  As directed   [place]  Remove items at home which could result in a fall. This includes throw rugs or furniture in walking pathways ICE  to the affected joint every three hours while awake for 30 minutes at a time, for at least the first 3-5 days, and then as needed for pain and swelling.  Continue to use ice for pain and swelling. You may notice swelling that will progress down to the foot and ankle.  This is normal after surgery.  Elevate your leg when you are not up walking on it.   Continue to use the breathing machine you got in the hospital (incentive spirometer) which will help keep your temperature down.  It is common for your temperature to cycle up and down following surgery, especially at night when you are not up moving around and exerting yourself.  The breathing machine keeps your lungs expanded and your temperature down.   DIET:  As you were doing prior to hospitalization, we recommend a well-balanced diet.  DRESSING / WOUND CARE / SHOWERING  Keep the surgical dressing until follow up.  The dressing is water proof, so you can shower without any extra covering.  IF THE DRESSING FALLS OFF or the wound gets wet inside, change the dressing with sterile gauze.  Please use good hand washing techniques before changing the dressing.  Do not use any lotions or creams on the incision until instructed by your surgeon.    ACTIVITY  Increase activity slowly as tolerated, but follow the weight bearing instructions below.   No driving for 6 weeks or until further direction given by your physician.  You cannot drive while taking narcotics.  No lifting or carrying greater than 10 lbs. until further directed by your surgeon. Avoid periods of inactivity such as sitting longer than an hour when not asleep. This helps prevent blood clots.  You may return to work once you are authorized by your doctor.     WEIGHT BEARING   Weight bearing as tolerated with assist device (walker, cane, etc) as directed, use it as long as suggested by your surgeon or therapist, typically at least 2-4 weeks.   EXERCISES  Results after joint  replacement surgery are often greatly improved when you follow the exercise, range of motion and muscle strengthening exercises prescribed by your doctor. Safety measures are also important to protect the joint from further injury. Any time any of these exercises cause you to have increased pain or swelling, decrease what you are doing until you are comfortable again and then slowly increase them. If you have problems or questions, call your caregiver or physical therapist for advice.   Rehabilitation is important following a joint replacement. After just a few days of immobilization, the muscles of the leg can become weakened and shrink (atrophy).  These exercises are designed to build up the tone and strength of the thigh and leg muscles and to improve motion. Often times heat used for twenty to thirty minutes before working out will loosen up your tissues and help with improving the range of motion but do not use heat for the first two weeks following surgery (sometimes heat can increase post-operative swelling).   These exercises can be done on a training (exercise) mat, on the floor, on a table or on a bed. Use whatever works the best and is most comfortable for you.    Use music or television while you are exercising so that the exercises are a pleasant break in your day. This will make your  life better with the exercises acting as a break in your routine that you can look forward to.   Perform all exercises about fifteen times, three times per day or as directed.  You should exercise both the operative leg and the other leg as well.   Exercises include:   Quad Sets - Tighten up the muscle on the front of the thigh (Quad) and hold for 5-10 seconds.   Straight Leg Raises - With your knee straight (if you were given a brace, keep it on), lift the leg to 60 degrees, hold for 3 seconds, and slowly lower the leg.  Perform this exercise against resistance later as your leg gets stronger.  Leg Slides: Lying  on your back, slowly slide your foot toward your buttocks, bending your knee up off the floor (only go as far as is comfortable). Then slowly slide your foot back down until your leg is flat on the floor again.  Angel Wings: Lying on your back spread your legs to the side as far apart as you can without causing discomfort.  Hamstring Strength:  Lying on your back, push your heel against the floor with your leg straight by tightening up the muscles of your buttocks.  Repeat, but this time bend your knee to a comfortable angle, and push your heel against the floor.  You may put a pillow under the heel to make it more comfortable if necessary.   A rehabilitation program following joint replacement surgery can speed recovery and prevent re-injury in the future due to weakened muscles. Contact your doctor or a physical therapist for more information on knee rehabilitation.    CONSTIPATION  Constipation is defined medically as fewer than three stools per week and severe constipation as less than one stool per week.  Even if you have a regular bowel pattern at home, your normal regimen is likely to be disrupted due to multiple reasons following surgery.  Combination of anesthesia, postoperative narcotics, change in appetite and fluid intake all can affect your bowels.   YOU MUST use at least one of the following options; they are listed in order of increasing strength to get the job done.  They are all available over the counter, and you may need to use some, POSSIBLY even all of these options:    Drink plenty of fluids (prune juice may be helpful) and high fiber foods Colace 100 mg by mouth twice a day  Senokot for constipation as directed and as needed Dulcolax (bisacodyl), take with full glass of water  Miralax (polyethylene glycol) once or twice a day as needed.  If you have tried all these things and are unable to have a bowel movement in the first 3-4 days after surgery call either your surgeon or  your primary doctor.    If you experience loose stools or diarrhea, hold the medications until you stool forms back up.  If your symptoms do not get better within 1 week or if they get worse, check with your doctor.  If you experience "the worst abdominal pain ever" or develop nausea or vomiting, please contact the office immediately for further recommendations for treatment.   ITCHING:  If you experience itching with your medications, try taking only a single pain pill, or even half a pain pill at a time.  You can also use Benadryl over the counter for itching or also to help with sleep.   TED HOSE STOCKINGS:  Use stockings on both legs until for at  least 2 weeks or as directed by physician office. They may be removed at night for sleeping.  MEDICATIONS:  See your medication summary on the "After Visit Summary" that nursing will review with you.  You may have some home medications which will be placed on hold until you complete the course of blood thinner medication.  It is important for you to complete the blood thinner medication as prescribed.  PRECAUTIONS:  If you experience chest pain or shortness of breath - call 911 immediately for transfer to the hospital emergency department.   If you develop a fever greater that 101 F, purulent drainage from wound, increased redness or drainage from wound, foul odor from the wound/dressing, or calf pain - CONTACT YOUR SURGEON.                                                   FOLLOW-UP APPOINTMENTS:  If you do not already have a post-op appointment, please call the office for an appointment to be seen by your surgeon.  Guidelines for how soon to be seen are listed in your "After Visit Summary", but are typically between 1-4 weeks after surgery.  OTHER INSTRUCTIONS:   Knee Replacement:  Do not place pillow under knee, focus on keeping the knee straight while resting. CPM instructions: 0-90 degrees, 2 hours in the morning, 2 hours in the afternoon, and 2  hours in the evening. Place foam block, curve side up under heel at all times except when in CPM or when walking.  DO NOT modify, tear, cut, or change the foam block in any way.  MAKE SURE YOU:  Understand these instructions.  Get help right away if you are not doing well or get worse.    Thank you for letting us be a part of your medical care team.  It is a privilege we respect greatly.  We hope these instructions will help you stay on track for a fast and full recovery!     Do not put a pillow under the knee. Place it under the heel.    Complete by:  As directed   Place gray foam block, curve side up under heel at all times except when in CPM or when walking.  DO NOT modify, tear, cut, or change in any way the gray foam block.     Increase activity slowly as tolerated    Complete by:  As directed      TED hose    Complete by:  As directed   Use stockings (TED hose) for 2 weeks on both leg(s).  You may remove them at night for sleeping.           Follow-up Information    Follow up with Lorn Junes, MD On 08/15/2015.   Specialty:  Orthopedic Surgery   Why:  APPT TIME 3:10 PM   Contact information:   [address] [place] [address] [phone]        Signed: Linda Hedges 08/03/2015, 12:16 PM

## 2015-08-04 DIAGNOSIS — I10 Essential (primary) hypertension: Secondary | ICD-10-CM | POA: Diagnosis not present

## 2015-08-04 DIAGNOSIS — M1990 Unspecified osteoarthritis, unspecified site: Secondary | ICD-10-CM | POA: Diagnosis not present

## 2015-08-04 DIAGNOSIS — Z87891 Personal history of nicotine dependence: Secondary | ICD-10-CM | POA: Diagnosis not present

## 2015-08-04 DIAGNOSIS — Z471 Aftercare following joint replacement surgery: Secondary | ICD-10-CM | POA: Diagnosis not present

## 2015-08-04 DIAGNOSIS — Z96653 Presence of artificial knee joint, bilateral: Secondary | ICD-10-CM | POA: Diagnosis not present

## 2015-08-08 DIAGNOSIS — Z96653 Presence of artificial knee joint, bilateral: Secondary | ICD-10-CM | POA: Diagnosis not present

## 2015-08-08 DIAGNOSIS — M1990 Unspecified osteoarthritis, unspecified site: Secondary | ICD-10-CM | POA: Diagnosis not present

## 2015-08-08 DIAGNOSIS — I10 Essential (primary) hypertension: Secondary | ICD-10-CM | POA: Diagnosis not present

## 2015-08-08 DIAGNOSIS — Z471 Aftercare following joint replacement surgery: Secondary | ICD-10-CM | POA: Diagnosis not present

## 2015-08-08 DIAGNOSIS — Z87891 Personal history of nicotine dependence: Secondary | ICD-10-CM | POA: Diagnosis not present

## 2015-08-11 DIAGNOSIS — Z471 Aftercare following joint replacement surgery: Secondary | ICD-10-CM | POA: Diagnosis not present

## 2015-08-11 DIAGNOSIS — Z96653 Presence of artificial knee joint, bilateral: Secondary | ICD-10-CM | POA: Diagnosis not present

## 2015-08-11 DIAGNOSIS — M1990 Unspecified osteoarthritis, unspecified site: Secondary | ICD-10-CM | POA: Diagnosis not present

## 2015-08-11 DIAGNOSIS — Z87891 Personal history of nicotine dependence: Secondary | ICD-10-CM | POA: Diagnosis not present

## 2015-08-11 DIAGNOSIS — I10 Essential (primary) hypertension: Secondary | ICD-10-CM | POA: Diagnosis not present

## 2015-08-12 ENCOUNTER — Ambulatory Visit (HOSPITAL_COMMUNITY)
Admission: RE | Admit: 2015-08-12 | Discharge: 2015-08-12 | Disposition: A | Discharge status: Home / Self Care | Payer: Medicare Other | Source: Ambulatory Visit | Attending: Orthopedic Surgery | Admitting: Orthopedic Surgery

## 2015-08-12 ENCOUNTER — Other Ambulatory Visit: Payer: Self-pay | Admitting: Physician Assistant

## 2015-08-12 DIAGNOSIS — Z96652 Presence of left artificial knee joint: Secondary | ICD-10-CM

## 2015-08-12 DIAGNOSIS — M79662 Pain in left lower leg: Secondary | ICD-10-CM

## 2015-08-12 DIAGNOSIS — M7989 Other specified soft tissue disorders: Secondary | ICD-10-CM | POA: Insufficient documentation

## 2015-08-12 DIAGNOSIS — R52 Pain, unspecified: Secondary | ICD-10-CM

## 2015-08-12 DIAGNOSIS — Z96659 Presence of unspecified artificial knee joint: Secondary | ICD-10-CM | POA: Insufficient documentation

## 2015-08-12 DIAGNOSIS — M79605 Pain in left leg: Secondary | ICD-10-CM | POA: Diagnosis not present

## 2015-08-12 NOTE — Progress Notes (Signed)
VASCULAR LAB PRELIMINARY  PRELIMINARY  PRELIMINARY  PRELIMINARY  Left lower extremity venous duplex completed.    Preliminary report:  There is no DVT or SVT noted in the left lower extremity.  There is significant interstitial fluid noted throughout the left lower extremity, especially in the calf.  Loucille Takach, RVT 08/12/2015, 12:21 PM

## 2015-08-14 DIAGNOSIS — M1990 Unspecified osteoarthritis, unspecified site: Secondary | ICD-10-CM | POA: Diagnosis not present

## 2015-08-14 DIAGNOSIS — Z471 Aftercare following joint replacement surgery: Secondary | ICD-10-CM | POA: Diagnosis not present

## 2015-08-14 DIAGNOSIS — Z87891 Personal history of nicotine dependence: Secondary | ICD-10-CM | POA: Diagnosis not present

## 2015-08-14 DIAGNOSIS — Z96653 Presence of artificial knee joint, bilateral: Secondary | ICD-10-CM | POA: Diagnosis not present

## 2015-08-14 DIAGNOSIS — I10 Essential (primary) hypertension: Secondary | ICD-10-CM | POA: Diagnosis not present

## 2015-08-15 DIAGNOSIS — Z96652 Presence of left artificial knee joint: Secondary | ICD-10-CM | POA: Diagnosis not present

## 2015-08-16 DIAGNOSIS — M6281 Muscle weakness (generalized): Secondary | ICD-10-CM | POA: Diagnosis not present

## 2015-08-16 DIAGNOSIS — M25562 Pain in left knee: Secondary | ICD-10-CM | POA: Diagnosis not present

## 2015-08-16 DIAGNOSIS — M25462 Effusion, left knee: Secondary | ICD-10-CM | POA: Diagnosis not present

## 2015-08-16 DIAGNOSIS — M25662 Stiffness of left knee, not elsewhere classified: Secondary | ICD-10-CM | POA: Diagnosis not present

## 2015-08-17 DIAGNOSIS — M25562 Pain in left knee: Secondary | ICD-10-CM | POA: Diagnosis not present

## 2015-08-17 DIAGNOSIS — M25662 Stiffness of left knee, not elsewhere classified: Secondary | ICD-10-CM | POA: Diagnosis not present

## 2015-08-17 DIAGNOSIS — M25462 Effusion, left knee: Secondary | ICD-10-CM | POA: Diagnosis not present

## 2015-08-17 DIAGNOSIS — M6281 Muscle weakness (generalized): Secondary | ICD-10-CM | POA: Diagnosis not present

## 2015-08-22 DIAGNOSIS — M25462 Effusion, left knee: Secondary | ICD-10-CM | POA: Diagnosis not present

## 2015-08-22 DIAGNOSIS — M6281 Muscle weakness (generalized): Secondary | ICD-10-CM | POA: Diagnosis not present

## 2015-08-22 DIAGNOSIS — M25662 Stiffness of left knee, not elsewhere classified: Secondary | ICD-10-CM | POA: Diagnosis not present

## 2015-08-22 DIAGNOSIS — M25562 Pain in left knee: Secondary | ICD-10-CM | POA: Diagnosis not present

## 2015-08-25 DIAGNOSIS — M25662 Stiffness of left knee, not elsewhere classified: Secondary | ICD-10-CM | POA: Diagnosis not present

## 2015-08-25 DIAGNOSIS — M25562 Pain in left knee: Secondary | ICD-10-CM | POA: Diagnosis not present

## 2015-08-25 DIAGNOSIS — M25462 Effusion, left knee: Secondary | ICD-10-CM | POA: Diagnosis not present

## 2015-08-25 DIAGNOSIS — M6281 Muscle weakness (generalized): Secondary | ICD-10-CM | POA: Diagnosis not present

## 2015-08-28 DIAGNOSIS — M6281 Muscle weakness (generalized): Secondary | ICD-10-CM | POA: Diagnosis not present

## 2015-08-28 DIAGNOSIS — M25662 Stiffness of left knee, not elsewhere classified: Secondary | ICD-10-CM | POA: Diagnosis not present

## 2015-08-28 DIAGNOSIS — M25462 Effusion, left knee: Secondary | ICD-10-CM | POA: Diagnosis not present

## 2015-08-28 DIAGNOSIS — M25562 Pain in left knee: Secondary | ICD-10-CM | POA: Diagnosis not present

## 2015-08-29 DIAGNOSIS — M25662 Stiffness of left knee, not elsewhere classified: Secondary | ICD-10-CM | POA: Diagnosis not present

## 2015-08-29 DIAGNOSIS — M6281 Muscle weakness (generalized): Secondary | ICD-10-CM | POA: Diagnosis not present

## 2015-08-29 DIAGNOSIS — M25462 Effusion, left knee: Secondary | ICD-10-CM | POA: Diagnosis not present

## 2015-08-29 DIAGNOSIS — M25562 Pain in left knee: Secondary | ICD-10-CM | POA: Diagnosis not present

## 2015-08-30 DIAGNOSIS — M6281 Muscle weakness (generalized): Secondary | ICD-10-CM | POA: Diagnosis not present

## 2015-08-30 DIAGNOSIS — M25562 Pain in left knee: Secondary | ICD-10-CM | POA: Diagnosis not present

## 2015-08-30 DIAGNOSIS — M25462 Effusion, left knee: Secondary | ICD-10-CM | POA: Diagnosis not present

## 2015-08-30 DIAGNOSIS — M25662 Stiffness of left knee, not elsewhere classified: Secondary | ICD-10-CM | POA: Diagnosis not present

## 2015-09-04 DIAGNOSIS — M6281 Muscle weakness (generalized): Secondary | ICD-10-CM | POA: Diagnosis not present

## 2015-09-04 DIAGNOSIS — M25462 Effusion, left knee: Secondary | ICD-10-CM | POA: Diagnosis not present

## 2015-09-04 DIAGNOSIS — M25662 Stiffness of left knee, not elsewhere classified: Secondary | ICD-10-CM | POA: Diagnosis not present

## 2015-09-04 DIAGNOSIS — M25562 Pain in left knee: Secondary | ICD-10-CM | POA: Diagnosis not present

## 2015-09-06 DIAGNOSIS — M25562 Pain in left knee: Secondary | ICD-10-CM | POA: Diagnosis not present

## 2015-09-06 DIAGNOSIS — M25462 Effusion, left knee: Secondary | ICD-10-CM | POA: Diagnosis not present

## 2015-09-06 DIAGNOSIS — M25662 Stiffness of left knee, not elsewhere classified: Secondary | ICD-10-CM | POA: Diagnosis not present

## 2015-09-06 DIAGNOSIS — M6281 Muscle weakness (generalized): Secondary | ICD-10-CM | POA: Diagnosis not present

## 2015-09-08 DIAGNOSIS — M25662 Stiffness of left knee, not elsewhere classified: Secondary | ICD-10-CM | POA: Diagnosis not present

## 2015-09-08 DIAGNOSIS — M25562 Pain in left knee: Secondary | ICD-10-CM | POA: Diagnosis not present

## 2015-09-08 DIAGNOSIS — M6281 Muscle weakness (generalized): Secondary | ICD-10-CM | POA: Diagnosis not present

## 2015-09-08 DIAGNOSIS — M25462 Effusion, left knee: Secondary | ICD-10-CM | POA: Diagnosis not present

## 2015-09-11 DIAGNOSIS — M25662 Stiffness of left knee, not elsewhere classified: Secondary | ICD-10-CM | POA: Diagnosis not present

## 2015-09-11 DIAGNOSIS — M6281 Muscle weakness (generalized): Secondary | ICD-10-CM | POA: Diagnosis not present

## 2015-09-11 DIAGNOSIS — M25462 Effusion, left knee: Secondary | ICD-10-CM | POA: Diagnosis not present

## 2015-09-11 DIAGNOSIS — M25562 Pain in left knee: Secondary | ICD-10-CM | POA: Diagnosis not present

## 2015-09-12 DIAGNOSIS — Z96652 Presence of left artificial knee joint: Secondary | ICD-10-CM | POA: Diagnosis not present

## 2015-09-13 DIAGNOSIS — M25662 Stiffness of left knee, not elsewhere classified: Secondary | ICD-10-CM | POA: Diagnosis not present

## 2015-09-13 DIAGNOSIS — M25462 Effusion, left knee: Secondary | ICD-10-CM | POA: Diagnosis not present

## 2015-09-13 DIAGNOSIS — M25562 Pain in left knee: Secondary | ICD-10-CM | POA: Diagnosis not present

## 2015-09-13 DIAGNOSIS — M6281 Muscle weakness (generalized): Secondary | ICD-10-CM | POA: Diagnosis not present

## 2015-09-15 DIAGNOSIS — M25562 Pain in left knee: Secondary | ICD-10-CM | POA: Diagnosis not present

## 2015-09-15 DIAGNOSIS — M6281 Muscle weakness (generalized): Secondary | ICD-10-CM | POA: Diagnosis not present

## 2015-09-15 DIAGNOSIS — M25462 Effusion, left knee: Secondary | ICD-10-CM | POA: Diagnosis not present

## 2015-09-15 DIAGNOSIS — M25662 Stiffness of left knee, not elsewhere classified: Secondary | ICD-10-CM | POA: Diagnosis not present

## 2015-09-18 DIAGNOSIS — M6281 Muscle weakness (generalized): Secondary | ICD-10-CM | POA: Diagnosis not present

## 2015-09-18 DIAGNOSIS — M25462 Effusion, left knee: Secondary | ICD-10-CM | POA: Diagnosis not present

## 2015-09-18 DIAGNOSIS — M25562 Pain in left knee: Secondary | ICD-10-CM | POA: Diagnosis not present

## 2015-09-18 DIAGNOSIS — M25662 Stiffness of left knee, not elsewhere classified: Secondary | ICD-10-CM | POA: Diagnosis not present

## 2015-09-20 DIAGNOSIS — M6281 Muscle weakness (generalized): Secondary | ICD-10-CM | POA: Diagnosis not present

## 2015-09-20 DIAGNOSIS — M25562 Pain in left knee: Secondary | ICD-10-CM | POA: Diagnosis not present

## 2015-09-20 DIAGNOSIS — M25662 Stiffness of left knee, not elsewhere classified: Secondary | ICD-10-CM | POA: Diagnosis not present

## 2015-09-20 DIAGNOSIS — M25462 Effusion, left knee: Secondary | ICD-10-CM | POA: Diagnosis not present

## 2015-09-22 DIAGNOSIS — M25662 Stiffness of left knee, not elsewhere classified: Secondary | ICD-10-CM | POA: Diagnosis not present

## 2015-09-22 DIAGNOSIS — M25562 Pain in left knee: Secondary | ICD-10-CM | POA: Diagnosis not present

## 2015-09-22 DIAGNOSIS — M25462 Effusion, left knee: Secondary | ICD-10-CM | POA: Diagnosis not present

## 2015-09-22 DIAGNOSIS — M6281 Muscle weakness (generalized): Secondary | ICD-10-CM | POA: Diagnosis not present

## 2015-09-25 DIAGNOSIS — M6281 Muscle weakness (generalized): Secondary | ICD-10-CM | POA: Diagnosis not present

## 2015-09-25 DIAGNOSIS — M25662 Stiffness of left knee, not elsewhere classified: Secondary | ICD-10-CM | POA: Diagnosis not present

## 2015-09-25 DIAGNOSIS — M25562 Pain in left knee: Secondary | ICD-10-CM | POA: Diagnosis not present

## 2015-09-25 DIAGNOSIS — M25462 Effusion, left knee: Secondary | ICD-10-CM | POA: Diagnosis not present

## 2015-09-28 DIAGNOSIS — M25662 Stiffness of left knee, not elsewhere classified: Secondary | ICD-10-CM | POA: Diagnosis not present

## 2015-09-28 DIAGNOSIS — M25562 Pain in left knee: Secondary | ICD-10-CM | POA: Diagnosis not present

## 2015-09-28 DIAGNOSIS — M6281 Muscle weakness (generalized): Secondary | ICD-10-CM | POA: Diagnosis not present

## 2015-09-28 DIAGNOSIS — M25462 Effusion, left knee: Secondary | ICD-10-CM | POA: Diagnosis not present

## 2015-09-29 DIAGNOSIS — M25462 Effusion, left knee: Secondary | ICD-10-CM | POA: Diagnosis not present

## 2015-09-29 DIAGNOSIS — M6281 Muscle weakness (generalized): Secondary | ICD-10-CM | POA: Diagnosis not present

## 2015-09-29 DIAGNOSIS — M25662 Stiffness of left knee, not elsewhere classified: Secondary | ICD-10-CM | POA: Diagnosis not present

## 2015-09-29 DIAGNOSIS — M25562 Pain in left knee: Secondary | ICD-10-CM | POA: Diagnosis not present

## 2015-10-04 DIAGNOSIS — M6281 Muscle weakness (generalized): Secondary | ICD-10-CM | POA: Diagnosis not present

## 2015-10-04 DIAGNOSIS — M25562 Pain in left knee: Secondary | ICD-10-CM | POA: Diagnosis not present

## 2015-10-04 DIAGNOSIS — M25662 Stiffness of left knee, not elsewhere classified: Secondary | ICD-10-CM | POA: Diagnosis not present

## 2015-10-04 DIAGNOSIS — M25462 Effusion, left knee: Secondary | ICD-10-CM | POA: Diagnosis not present

## 2015-10-06 DIAGNOSIS — M25462 Effusion, left knee: Secondary | ICD-10-CM | POA: Diagnosis not present

## 2015-10-06 DIAGNOSIS — M25662 Stiffness of left knee, not elsewhere classified: Secondary | ICD-10-CM | POA: Diagnosis not present

## 2015-10-06 DIAGNOSIS — M25562 Pain in left knee: Secondary | ICD-10-CM | POA: Diagnosis not present

## 2015-10-06 DIAGNOSIS — M6281 Muscle weakness (generalized): Secondary | ICD-10-CM | POA: Diagnosis not present

## 2015-10-10 DIAGNOSIS — Z96652 Presence of left artificial knee joint: Secondary | ICD-10-CM | POA: Diagnosis not present

## 2015-10-11 DIAGNOSIS — M25562 Pain in left knee: Secondary | ICD-10-CM | POA: Diagnosis not present

## 2015-10-11 DIAGNOSIS — M25662 Stiffness of left knee, not elsewhere classified: Secondary | ICD-10-CM | POA: Diagnosis not present

## 2015-10-11 DIAGNOSIS — M25462 Effusion, left knee: Secondary | ICD-10-CM | POA: Diagnosis not present

## 2015-10-11 DIAGNOSIS — M6281 Muscle weakness (generalized): Secondary | ICD-10-CM | POA: Diagnosis not present

## 2015-10-13 DIAGNOSIS — M6281 Muscle weakness (generalized): Secondary | ICD-10-CM | POA: Diagnosis not present

## 2015-10-13 DIAGNOSIS — M25562 Pain in left knee: Secondary | ICD-10-CM | POA: Diagnosis not present

## 2015-10-13 DIAGNOSIS — M25462 Effusion, left knee: Secondary | ICD-10-CM | POA: Diagnosis not present

## 2015-10-13 DIAGNOSIS — M25662 Stiffness of left knee, not elsewhere classified: Secondary | ICD-10-CM | POA: Diagnosis not present

## 2015-10-18 DIAGNOSIS — M6281 Muscle weakness (generalized): Secondary | ICD-10-CM | POA: Diagnosis not present

## 2015-10-18 DIAGNOSIS — M25462 Effusion, left knee: Secondary | ICD-10-CM | POA: Diagnosis not present

## 2015-10-18 DIAGNOSIS — M25662 Stiffness of left knee, not elsewhere classified: Secondary | ICD-10-CM | POA: Diagnosis not present

## 2015-10-18 DIAGNOSIS — M25562 Pain in left knee: Secondary | ICD-10-CM | POA: Diagnosis not present

## 2015-10-19 DIAGNOSIS — M25562 Pain in left knee: Secondary | ICD-10-CM | POA: Diagnosis not present

## 2015-10-19 DIAGNOSIS — M25662 Stiffness of left knee, not elsewhere classified: Secondary | ICD-10-CM | POA: Diagnosis not present

## 2015-10-19 DIAGNOSIS — M25462 Effusion, left knee: Secondary | ICD-10-CM | POA: Diagnosis not present

## 2015-10-19 DIAGNOSIS — M6281 Muscle weakness (generalized): Secondary | ICD-10-CM | POA: Diagnosis not present

## 2015-10-24 DIAGNOSIS — M25462 Effusion, left knee: Secondary | ICD-10-CM | POA: Diagnosis not present

## 2015-10-24 DIAGNOSIS — M25562 Pain in left knee: Secondary | ICD-10-CM | POA: Diagnosis not present

## 2015-10-24 DIAGNOSIS — M25662 Stiffness of left knee, not elsewhere classified: Secondary | ICD-10-CM | POA: Diagnosis not present

## 2015-10-24 DIAGNOSIS — M6281 Muscle weakness (generalized): Secondary | ICD-10-CM | POA: Diagnosis not present

## 2015-10-26 DIAGNOSIS — M25462 Effusion, left knee: Secondary | ICD-10-CM | POA: Diagnosis not present

## 2015-10-26 DIAGNOSIS — M6281 Muscle weakness (generalized): Secondary | ICD-10-CM | POA: Diagnosis not present

## 2015-10-26 DIAGNOSIS — M25662 Stiffness of left knee, not elsewhere classified: Secondary | ICD-10-CM | POA: Diagnosis not present

## 2015-10-26 DIAGNOSIS — M25562 Pain in left knee: Secondary | ICD-10-CM | POA: Diagnosis not present

## 2015-10-30 DIAGNOSIS — M25562 Pain in left knee: Secondary | ICD-10-CM | POA: Diagnosis not present

## 2015-10-30 DIAGNOSIS — M25662 Stiffness of left knee, not elsewhere classified: Secondary | ICD-10-CM | POA: Diagnosis not present

## 2015-10-30 DIAGNOSIS — M25462 Effusion, left knee: Secondary | ICD-10-CM | POA: Diagnosis not present

## 2015-10-30 DIAGNOSIS — M6281 Muscle weakness (generalized): Secondary | ICD-10-CM | POA: Diagnosis not present

## 2015-11-02 DIAGNOSIS — M25562 Pain in left knee: Secondary | ICD-10-CM | POA: Diagnosis not present

## 2015-11-02 DIAGNOSIS — M25662 Stiffness of left knee, not elsewhere classified: Secondary | ICD-10-CM | POA: Diagnosis not present

## 2015-11-02 DIAGNOSIS — M25462 Effusion, left knee: Secondary | ICD-10-CM | POA: Diagnosis not present

## 2015-11-02 DIAGNOSIS — M6281 Muscle weakness (generalized): Secondary | ICD-10-CM | POA: Diagnosis not present

## 2015-11-06 DIAGNOSIS — M25462 Effusion, left knee: Secondary | ICD-10-CM | POA: Diagnosis not present

## 2015-11-06 DIAGNOSIS — M25662 Stiffness of left knee, not elsewhere classified: Secondary | ICD-10-CM | POA: Diagnosis not present

## 2015-11-06 DIAGNOSIS — M6281 Muscle weakness (generalized): Secondary | ICD-10-CM | POA: Diagnosis not present

## 2015-11-06 DIAGNOSIS — M25562 Pain in left knee: Secondary | ICD-10-CM | POA: Diagnosis not present

## 2015-11-07 DIAGNOSIS — Z96652 Presence of left artificial knee joint: Secondary | ICD-10-CM | POA: Diagnosis not present

## 2016-01-04 ENCOUNTER — Other Ambulatory Visit (HOSPITAL_COMMUNITY): Payer: Self-pay | Admitting: Internal Medicine

## 2016-01-04 DIAGNOSIS — Z1231 Encounter for screening mammogram for malignant neoplasm of breast: Secondary | ICD-10-CM

## 2016-01-12 ENCOUNTER — Ambulatory Visit (HOSPITAL_COMMUNITY)
Admission: RE | Admit: 2016-01-12 | Discharge: 2016-01-12 | Disposition: A | Discharge status: Home / Self Care | Payer: Medicare Other | Source: Ambulatory Visit | Attending: Internal Medicine | Admitting: Internal Medicine

## 2016-01-12 DIAGNOSIS — Z1231 Encounter for screening mammogram for malignant neoplasm of breast: Secondary | ICD-10-CM

## 2016-02-06 DIAGNOSIS — M170 Bilateral primary osteoarthritis of knee: Secondary | ICD-10-CM | POA: Diagnosis not present

## 2016-02-20 DIAGNOSIS — Z79899 Other long term (current) drug therapy: Secondary | ICD-10-CM | POA: Diagnosis not present

## 2016-02-20 DIAGNOSIS — R5383 Other fatigue: Secondary | ICD-10-CM | POA: Diagnosis not present

## 2016-02-20 DIAGNOSIS — N951 Menopausal and female climacteric states: Secondary | ICD-10-CM | POA: Diagnosis not present

## 2016-02-20 DIAGNOSIS — E039 Hypothyroidism, unspecified: Secondary | ICD-10-CM | POA: Diagnosis not present

## 2016-03-12 DIAGNOSIS — Z961 Presence of intraocular lens: Secondary | ICD-10-CM | POA: Diagnosis not present

## 2016-06-05 ENCOUNTER — Other Ambulatory Visit: Payer: Self-pay

## 2016-06-18 DIAGNOSIS — E039 Hypothyroidism, unspecified: Secondary | ICD-10-CM | POA: Diagnosis not present

## 2016-06-18 DIAGNOSIS — N951 Menopausal and female climacteric states: Secondary | ICD-10-CM | POA: Diagnosis not present

## 2016-06-18 DIAGNOSIS — Z79890 Hormone replacement therapy (postmenopausal): Secondary | ICD-10-CM | POA: Diagnosis not present

## 2016-06-18 DIAGNOSIS — Z7982 Long term (current) use of aspirin: Secondary | ICD-10-CM | POA: Diagnosis not present

## 2016-06-20 DIAGNOSIS — M1990 Unspecified osteoarthritis, unspecified site: Secondary | ICD-10-CM | POA: Diagnosis not present

## 2016-06-20 DIAGNOSIS — Z79899 Other long term (current) drug therapy: Secondary | ICD-10-CM | POA: Diagnosis not present

## 2016-06-20 DIAGNOSIS — E039 Hypothyroidism, unspecified: Secondary | ICD-10-CM | POA: Diagnosis not present

## 2016-06-20 DIAGNOSIS — I10 Essential (primary) hypertension: Secondary | ICD-10-CM | POA: Diagnosis not present

## 2016-06-20 DIAGNOSIS — K219 Gastro-esophageal reflux disease without esophagitis: Secondary | ICD-10-CM | POA: Diagnosis not present

## 2016-06-27 DIAGNOSIS — K210 Gastro-esophageal reflux disease with esophagitis: Secondary | ICD-10-CM | POA: Diagnosis not present

## 2016-06-27 DIAGNOSIS — M1990 Unspecified osteoarthritis, unspecified site: Secondary | ICD-10-CM | POA: Diagnosis not present

## 2016-06-27 DIAGNOSIS — Z23 Encounter for immunization: Secondary | ICD-10-CM | POA: Diagnosis not present

## 2016-06-27 DIAGNOSIS — E030 Congenital hypothyroidism with diffuse goiter: Secondary | ICD-10-CM | POA: Diagnosis not present

## 2016-06-27 DIAGNOSIS — Z6827 Body mass index (BMI) 27.0-27.9, adult: Secondary | ICD-10-CM | POA: Diagnosis not present

## 2016-08-13 DIAGNOSIS — M170 Bilateral primary osteoarthritis of knee: Secondary | ICD-10-CM | POA: Diagnosis not present

## 2016-11-05 DIAGNOSIS — M25569 Pain in unspecified knee: Secondary | ICD-10-CM | POA: Diagnosis not present

## 2016-11-05 DIAGNOSIS — N951 Menopausal and female climacteric states: Secondary | ICD-10-CM | POA: Diagnosis not present

## 2016-11-05 DIAGNOSIS — E039 Hypothyroidism, unspecified: Secondary | ICD-10-CM | POA: Diagnosis not present

## 2016-11-05 DIAGNOSIS — M85852 Other specified disorders of bone density and structure, left thigh: Secondary | ICD-10-CM | POA: Diagnosis not present

## 2017-02-25 DIAGNOSIS — J309 Allergic rhinitis, unspecified: Secondary | ICD-10-CM | POA: Diagnosis not present

## 2017-02-25 DIAGNOSIS — N951 Menopausal and female climacteric states: Secondary | ICD-10-CM | POA: Diagnosis not present

## 2017-02-25 DIAGNOSIS — E039 Hypothyroidism, unspecified: Secondary | ICD-10-CM | POA: Diagnosis not present

## 2017-03-14 ENCOUNTER — Other Ambulatory Visit (HOSPITAL_COMMUNITY): Payer: Self-pay | Admitting: Internal Medicine

## 2017-03-14 DIAGNOSIS — Z1231 Encounter for screening mammogram for malignant neoplasm of breast: Secondary | ICD-10-CM

## 2017-03-20 ENCOUNTER — Ambulatory Visit (HOSPITAL_COMMUNITY)
Admission: RE | Admit: 2017-03-20 | Discharge: 2017-03-20 | Disposition: A | Discharge status: Home / Self Care | Payer: Medicare Other | Source: Ambulatory Visit | Attending: Internal Medicine | Admitting: Internal Medicine

## 2017-03-20 DIAGNOSIS — Z1231 Encounter for screening mammogram for malignant neoplasm of breast: Secondary | ICD-10-CM | POA: Diagnosis not present

## 2017-06-23 DIAGNOSIS — E785 Hyperlipidemia, unspecified: Secondary | ICD-10-CM | POA: Diagnosis not present

## 2017-06-23 DIAGNOSIS — Z79899 Other long term (current) drug therapy: Secondary | ICD-10-CM | POA: Diagnosis not present

## 2017-06-23 DIAGNOSIS — I10 Essential (primary) hypertension: Secondary | ICD-10-CM | POA: Diagnosis not present

## 2017-06-23 DIAGNOSIS — R7301 Impaired fasting glucose: Secondary | ICD-10-CM | POA: Diagnosis not present

## 2017-06-23 DIAGNOSIS — E039 Hypothyroidism, unspecified: Secondary | ICD-10-CM | POA: Diagnosis not present

## 2017-06-23 DIAGNOSIS — M1990 Unspecified osteoarthritis, unspecified site: Secondary | ICD-10-CM | POA: Diagnosis not present

## 2017-07-01 DIAGNOSIS — I4949 Other premature depolarization: Secondary | ICD-10-CM | POA: Diagnosis not present

## 2017-07-01 DIAGNOSIS — E039 Hypothyroidism, unspecified: Secondary | ICD-10-CM | POA: Diagnosis not present

## 2017-07-01 DIAGNOSIS — M8580 Other specified disorders of bone density and structure, unspecified site: Secondary | ICD-10-CM | POA: Diagnosis not present

## 2017-07-01 DIAGNOSIS — Z6827 Body mass index (BMI) 27.0-27.9, adult: Secondary | ICD-10-CM | POA: Diagnosis not present

## 2017-07-01 DIAGNOSIS — Z23 Encounter for immunization: Secondary | ICD-10-CM | POA: Diagnosis not present

## 2017-07-03 ENCOUNTER — Other Ambulatory Visit (HOSPITAL_COMMUNITY): Payer: Self-pay | Admitting: Internal Medicine

## 2017-07-03 DIAGNOSIS — Z780 Asymptomatic menopausal state: Secondary | ICD-10-CM

## 2017-07-04 ENCOUNTER — Encounter (INDEPENDENT_AMBULATORY_CARE_PROVIDER_SITE_OTHER): Payer: Self-pay | Admitting: *Deleted

## 2017-07-09 ENCOUNTER — Ambulatory Visit (HOSPITAL_COMMUNITY)
Admission: RE | Admit: 2017-07-09 | Discharge: 2017-07-09 | Disposition: A | Discharge status: Home / Self Care | Payer: Medicare Other | Source: Ambulatory Visit | Attending: Internal Medicine | Admitting: Internal Medicine

## 2017-07-09 ENCOUNTER — Other Ambulatory Visit (INDEPENDENT_AMBULATORY_CARE_PROVIDER_SITE_OTHER): Payer: Self-pay | Admitting: *Deleted

## 2017-07-09 DIAGNOSIS — Z780 Asymptomatic menopausal state: Secondary | ICD-10-CM

## 2017-07-09 DIAGNOSIS — Z86010 Personal history of colonic polyps: Secondary | ICD-10-CM

## 2017-07-09 DIAGNOSIS — M8589 Other specified disorders of bone density and structure, multiple sites: Secondary | ICD-10-CM | POA: Diagnosis not present

## 2017-08-04 DIAGNOSIS — R002 Palpitations: Secondary | ICD-10-CM | POA: Diagnosis not present

## 2017-09-10 ENCOUNTER — Encounter (INDEPENDENT_AMBULATORY_CARE_PROVIDER_SITE_OTHER): Payer: Self-pay | Admitting: *Deleted

## 2017-09-10 ENCOUNTER — Telehealth (INDEPENDENT_AMBULATORY_CARE_PROVIDER_SITE_OTHER): Payer: Self-pay | Admitting: *Deleted

## 2017-09-10 MED ORDER — PEG 3350-KCL-NA BICARB-NACL 420 G PO SOLR: 4000.0000 mL | Freq: Once | ORAL | 0 refills | Status: AC

## 2017-09-10 NOTE — Telephone Encounter (Signed)
Patient needs trilyte 

## 2017-09-11 ENCOUNTER — Other Ambulatory Visit: Payer: Self-pay

## 2017-09-18 ENCOUNTER — Telehealth (INDEPENDENT_AMBULATORY_CARE_PROVIDER_SITE_OTHER): Payer: Self-pay | Admitting: *Deleted

## 2017-09-18 NOTE — Telephone Encounter (Signed)
agree

## 2017-09-18 NOTE — Telephone Encounter (Signed)
Referring MD/PCP: fagan   Procedure: tcs  Reason/Indication:  Hx polyps  Has patient had this procedure before?  Yes, 2015  If so, when, by whom and where?    Is there a family history of colon cancer?  no  Who?  What age when diagnosed?    Is patient diabetic?   no      Does patient have prosthetic heart valve or mechanical valve?  no  Do you have a pacemaker?  no  Has patient ever had endocarditis? no  Has patient had joint replacement within last 12 months?  no  Is patient constipated or take laxatives? no  Does patient have a history of alcohol/drug use?  no  Is patient on Coumadin, Plavix and/or Aspirin? yes  Medications: asa 81 mg every other daily, metoprolol 25 mg daily, levothyroxine 137 ,cg daily, omeprazole 40 mg daily, vitamins  Allergies: nkda  Medication Adjustment per Dr Laural Golden: asa 2 days  Procedure date & time: 10/15/17 at 1030

## 2017-10-15 ENCOUNTER — Ambulatory Visit (HOSPITAL_COMMUNITY)
Admission: RE | Admit: 2017-10-15 | Discharge: 2017-10-15 | Disposition: A | Discharge status: Home / Self Care | Payer: Medicare Other | Source: Ambulatory Visit | Attending: Internal Medicine | Admitting: Internal Medicine

## 2017-10-15 ENCOUNTER — Encounter (HOSPITAL_COMMUNITY): Payer: Self-pay | Admitting: *Deleted

## 2017-10-15 ENCOUNTER — Encounter (HOSPITAL_COMMUNITY)
Admission: RE | Disposition: A | Discharge status: Home / Self Care | Payer: Self-pay | Source: Ambulatory Visit | Attending: Internal Medicine

## 2017-10-15 ENCOUNTER — Other Ambulatory Visit: Payer: Self-pay

## 2017-10-15 DIAGNOSIS — K644 Residual hemorrhoidal skin tags: Secondary | ICD-10-CM | POA: Diagnosis not present

## 2017-10-15 DIAGNOSIS — Z87891 Personal history of nicotine dependence: Secondary | ICD-10-CM | POA: Diagnosis not present

## 2017-10-15 DIAGNOSIS — Z79899 Other long term (current) drug therapy: Secondary | ICD-10-CM | POA: Insufficient documentation

## 2017-10-15 DIAGNOSIS — I10 Essential (primary) hypertension: Secondary | ICD-10-CM | POA: Diagnosis not present

## 2017-10-15 DIAGNOSIS — Z09 Encounter for follow-up examination after completed treatment for conditions other than malignant neoplasm: Secondary | ICD-10-CM | POA: Diagnosis not present

## 2017-10-15 DIAGNOSIS — D121 Benign neoplasm of appendix: Secondary | ICD-10-CM | POA: Diagnosis not present

## 2017-10-15 DIAGNOSIS — Z7982 Long term (current) use of aspirin: Secondary | ICD-10-CM | POA: Diagnosis not present

## 2017-10-15 DIAGNOSIS — K219 Gastro-esophageal reflux disease without esophagitis: Secondary | ICD-10-CM | POA: Insufficient documentation

## 2017-10-15 DIAGNOSIS — E039 Hypothyroidism, unspecified: Secondary | ICD-10-CM | POA: Diagnosis not present

## 2017-10-15 DIAGNOSIS — K6289 Other specified diseases of anus and rectum: Secondary | ICD-10-CM | POA: Insufficient documentation

## 2017-10-15 DIAGNOSIS — K5730 Diverticulosis of large intestine without perforation or abscess without bleeding: Secondary | ICD-10-CM | POA: Diagnosis not present

## 2017-10-15 DIAGNOSIS — Z86010 Personal history of colonic polyps: Secondary | ICD-10-CM | POA: Insufficient documentation

## 2017-10-15 DIAGNOSIS — M1990 Unspecified osteoarthritis, unspecified site: Secondary | ICD-10-CM | POA: Diagnosis not present

## 2017-10-15 DIAGNOSIS — D120 Benign neoplasm of cecum: Secondary | ICD-10-CM | POA: Diagnosis not present

## 2017-10-15 DIAGNOSIS — Z8719 Personal history of other diseases of the digestive system: Secondary | ICD-10-CM | POA: Diagnosis not present

## 2017-10-15 HISTORY — PX: POLYPECTOMY: SHX5525

## 2017-10-15 HISTORY — PX: HOT HEMOSTASIS: SHX5433

## 2017-10-15 HISTORY — PX: COLONOSCOPY: SHX5424

## 2017-10-15 SURGERY — COLONOSCOPY
Anesthesia: Moderate Sedation

## 2017-10-15 MED ORDER — MIDAZOLAM HCL 5 MG/5ML IJ SOLN
INTRAMUSCULAR | Status: DC | PRN
  Administered 2017-10-15: 1 mg via INTRAVENOUS
  Administered 2017-10-15: 2 mg via INTRAVENOUS
  Administered 2017-10-15 (×2): 1 mg via INTRAVENOUS
  Administered 2017-10-15: 2 mg via INTRAVENOUS
  Administered 2017-10-15 (×2): 1 mg via INTRAVENOUS

## 2017-10-15 MED ORDER — MEPERIDINE HCL 50 MG/ML IJ SOLN
INTRAMUSCULAR | Status: AC
  Filled 2017-10-15: qty 1

## 2017-10-15 MED ORDER — STERILE WATER FOR IRRIGATION IR SOLN
Status: DC | PRN
  Administered 2017-10-15: 11:00:00

## 2017-10-15 MED ORDER — MIDAZOLAM HCL 5 MG/5ML IJ SOLN
INTRAMUSCULAR | Status: AC
  Filled 2017-10-15: qty 10

## 2017-10-15 MED ORDER — SODIUM CHLORIDE 0.9 % IV SOLN
INTRAVENOUS | Status: DC
  Administered 2017-10-15: 1000 mL via INTRAVENOUS

## 2017-10-15 MED ORDER — MEPERIDINE HCL 50 MG/ML IJ SOLN
INTRAMUSCULAR | Status: DC | PRN
  Administered 2017-10-15 (×2): 25 mg via INTRAVENOUS

## 2017-10-15 NOTE — Op Note (Signed)
[facility] Patient Name: Evelyn Walker Procedure Date: 10/15/2017 10:21 AM MRN: [id number] Date of Birth: [birth year] Attending MD: Hildred Laser , MD CSN: [id number] Age: 83 Admit Type: Outpatient Procedure:                Colonoscopy Indications:              High risk colon cancer surveillance: Personal                            history of colonic polyps Providers:                Hildred Laser, MD, Otis Peak B. Sharon Seller, RN, Aram Candela Referring MD:             Asencion Noble, MD Medicines:                Meperidine 50 mg IV, Midazolam 9 mg IV Complications:            No immediate complications. Estimated Blood Loss:     Estimated blood loss was minimal. Procedure:                Pre-Anesthesia Assessment:                           - Prior to the procedure, a History and Physical                            was performed, and patient medications and                            allergies were reviewed. The patient's tolerance of                            previous anesthesia was also reviewed. The risks                            and benefits of the procedure and the sedation                            options and risks were discussed with the patient.                            All questions were answered, and informed consent                            was obtained. Prior Anticoagulants: The patient                            last took aspirin 7 days and naproxen 7 days prior                            to the procedure. ASA Grade Assessment: II - A  patient with mild systemic disease. After reviewing                            the risks and benefits, the patient was deemed in                            satisfactory condition to undergo the procedure.                           After obtaining informed consent, the colonoscope                            was passed under direct vision. Throughout the   procedure, the patient's blood pressure, pulse, and                            oxygen saturations were monitored continuously. The                            EC-[id number] ([id number]) scope was introduced through                            the anus and advanced to the the cecum, identified                            by appendiceal orifice and ileocecal valve. The                            [id number] ([id number]) scope was introduced through                            the and advanced to the. The colonoscopy was                            technically difficult and complex due to restricted                            mobility of the colon and a tortuous colon.                            Successful completion of the procedure was aided by                            increasing the dose of sedation medication,                            changing the patient's position, withdrawing and                            reinserting the scope and withdrawing the scope and                            replacing with the 'babyscope'. The patient  tolerated the procedure fairly well. The quality of                            the bowel preparation was adequate. The ileocecal                            valve, appendiceal orifice, and rectum were                            photographed. Scope In: 10:47:45 AM Scope Out: 11:32:28 AM Scope Withdrawal Time: 0 hours 12 minutes 46 seconds  Total Procedure Duration: 0 hours 44 minutes 43 seconds  Findings:      The perianal and digital rectal examinations were normal.      A small polyp was found in the appendiceal orifice. The polyp was       sessile. Biopsies were taken with a cold forceps for histology.       Coagulation for destruction of remaining portion of lesion using argon       plasma was successful.      Multiple small and large-mouthed diverticula were found in the sigmoid       colon.      External hemorrhoids were found during  retroflexion. The hemorrhoids       were small.      Anal papilla(e) were hypertrophied. Impression:               - One small polyp at the appendiceal orifice.                            Biopsied. Treated with argon plasma coagulation                            (APC).                           - Diverticulosis in the sigmoid colon.                           - External hemorrhoids.                           - Anal papilla(e) were hypertrophied. Moderate Sedation:      Moderate (conscious) sedation was administered by the endoscopy nurse       and supervised by the endoscopist. The following parameters were       monitored: oxygen saturation, heart rate, blood pressure, CO2       capnography and response to care. Total physician intraservice time was       50 minutes. Recommendation:           - Patient has a contact number available for                            emergencies. The signs and symptoms of potential                            delayed complications were discussed with the  patient. Return to normal activities tomorrow.                            Written discharge instructions were provided to the                            patient.                           - High fiber diet today.                           - Continue present medications.                           - No aspirin, ibuprofen, naproxen, or other                            non-steroidal anti-inflammatory drugs for 7 days.                           - Await pathology results.                           - Repeat colonoscopy is recommended. The                            colonoscopy date will be determined after pathology                            results from today's exam become available for                            review. Procedure Code(s):        --- Professional ---                           [phone], Colonoscopy, flexible; with ablation of                            tumor(s), polyp(s),  or other lesion(s) (includes                            pre- and post-dilation and guide wire passage, when                            performed)                           99152, Moderate sedation services provided by the                            same physician or other qualified health care                            professional performing the diagnostic or  therapeutic service that the sedation supports,                            requiring the presence of an independent trained                            observer to assist in the monitoring of the                            patient's level of consciousness and physiological                            status; initial 15 minutes of intraservice time,                            patient age 56 years or older                           [phone], Moderate sedation services; each additional                            15 minutes intraservice time                           [phone], Moderate sedation services; each additional                            15 minutes intraservice time Diagnosis Code(s):        --- Professional ---                           Z86.010, Personal history of colonic polyps                           D12.1, Benign neoplasm of appendix                           K64.4, Residual hemorrhoidal skin tags                           K62.89, Other specified diseases of anus and rectum                           K57.30, Diverticulosis of large intestine without                            perforation or abscess without bleeding CPT copyright 2016 [employer]. All rights reserved. The codes documented in this report are preliminary and upon coder review may  be revised to meet current compliance requirements. Hildred Laser, MD Hildred Laser, MD 10/15/2017 11:45:05 AM This report has been signed electronically. Number of Addenda: 0

## 2017-10-15 NOTE — H&P (Signed)
Evelyn Walker is an 83 y.o. female.   Chief Complaint: Patient is here for colonoscopy. HPI: Patient is 83 year old Caucasian female with history of colonic adenomas.  On her last exam of over 2015 she had sessile serrated polyp at appendiceal orifice which was carefully treated.  She remained asymptomatic.  She denies abdominal pain melena or rectal bleeding. Family history is negative for CRC.  Past Medical History:  Diagnosis Date  . Arthritis   . Essential hypertension   . GERD (gastroesophageal reflux disease)   . Hypothyroidism   . PONV (postoperative nausea and vomiting)    ? whether morphine or anesthesia  . Primary localized osteoarthritis of left knee   . Rhinitis     Past Surgical History:  Procedure Laterality Date  . CATARACT EXTRACTION Bilateral   . COLONOSCOPY N/A 07/27/2014   Procedure: COLONOSCOPY;  Surgeon: Rogene Houston, MD;  Location: [facility];  Service: Endoscopy;  Laterality: N/A;  100  . TOTAL KNEE ARTHROPLASTY Right 2004  . TOTAL KNEE ARTHROPLASTY Left 07/31/2015   Procedure: TOTAL KNEE ARTHROPLASTY;  Surgeon: Elsie Saas, MD;  Location: [facility];  Service: Orthopedics;  Laterality: Left;    Family History  Problem Relation Age of Onset  . Stroke Mother   . Heart attack Father   . Breast cancer Paternal Grandfather   . Diabetes Paternal Grandfather   . Colon cancer Neg Hx    Social History:  reports that she quit smoking about 31 years ago. Her smoking use included cigarettes. She started smoking about 60 years ago. She has a 2.50 pack-year smoking history. she has never used smokeless tobacco. She reports that she drinks alcohol. She reports that she does not use drugs.  Allergies: No Known Allergies  Medications Prior to Admission  Medication Sig Dispense Refill  . acetaminophen (TYLENOL) 500 MG tablet Take 1,000 mg by mouth 2 (two) times daily as needed for moderate pain.    Marland Kitchen amoxicillin (AMOXIL) 500 MG capsule Take 500 mg by mouth See admin  instructions. Take 4 capsules (2000 mg) by mouth one hour prior to dental appointments    . aspirin EC 81 MG tablet Take 81 mg by mouth every other day.    Marland Kitchen Bioflavonoid Products (ESTER C PO) Take 500 mg by mouth daily.    . Cholecalciferol (VITAMIN D-3) 5000 UNITS TABS Take 5,000 Units by mouth daily.     . Coenzyme Q10 (COQ-10) 100 MG CAPS Take 100 mg by mouth daily.    . diclofenac sodium (VOLTAREN) 1 % GEL Apply 1 application topically daily as needed (muscle pain).    . fexofenadine (ALLEGRA) 180 MG tablet Take 180 mg by mouth daily.    . Fish Oil-Cholecalciferol (FISH OIL + D3 PO) Take 1 capsule by mouth daily. 1200 mg fish oil, 2000 units D3    . fluticasone (FLONASE) 50 MCG/ACT nasal spray Place 1 spray into both nostrils at bedtime.     . Inulin (FIBER CHOICE PO) Take 1 tablet by mouth daily.    Marland Kitchen levothyroxine (SYNTHROID, LEVOTHROID) 137 MCG tablet Take 137 mcg by mouth daily before breakfast.    . metoprolol succinate (TOPROL-XL) 25 MG 24 hr tablet Take 25 mg by mouth daily with lunch.    . Multiple Vitamins-Minerals (HAIR SKIN AND NAILS FORMULA) TABS Take 1 tablet by mouth daily.    . Multiple Vitamins-Minerals (MULTI ADULT GUMMIES) CHEW Chew 1 capsule by mouth daily.    . naproxen sodium (ALEVE) 220 MG  tablet Take 440 mg by mouth daily as needed (pain).    Marland Kitchen omeprazole (PRILOSEC) 40 MG capsule Take 40 mg by mouth daily before breakfast.     . Polyethyl Glycol-Propyl Glycol (SYSTANE ULTRA OP) Place 1 drop into both eyes at bedtime.    . Probiotic Product (DIGESTIVE ADVANTAGE GUMMIES) CHEW Chew 1 tablet by mouth daily.    . celecoxib (CELEBREX) 200 MG capsule 1 tab po q day with food for pain and  swelling (Patient not taking: Reported on 10/03/2017) 30 capsule 0    No results found for this or any previous visit (from the past 48 hour(s)). No results found.  ROS  Blood pressure (!) 151/85, pulse 80, temperature 98.6 F (37 C), temperature source Oral, resp. rate (!) 9, height  5\' 5"  (1.651 m), weight 150 lb (68 kg), SpO2 99 %. Physical Exam  Constitutional: She appears well-developed and well-nourished.  HENT:  Mouth/Throat: Oropharynx is clear and moist.  Eyes: Conjunctivae are normal. No scleral icterus.  Neck: No thyromegaly present.  Cardiovascular: Normal rate, regular rhythm and normal heart sounds.  No murmur heard. Respiratory: Effort normal and breath sounds normal.  GI: Soft. She exhibits no distension and no mass. There is no tenderness.  Musculoskeletal: She exhibits no edema.  Lymphadenopathy:    She has no cervical adenopathy.  Neurological: She is alert.  Skin: Skin is warm and dry.     Assessment/Plan History of colonic adenomas and sessile serrated polyp. Surveillance colonoscopy.  Hildred Laser, MD 10/15/2017, 10:38 AM

## 2017-10-15 NOTE — Discharge Instructions (Signed)
No Aspirin and NSAIDs for one week. Resume usual medications as before. Resume high fiber diet. No driving for 24 hours. Physician will call with biopsy results.   Colonoscopy, Adult, Care After This sheet gives you information about how to care for yourself after your procedure. Your doctor may also give you more specific instructions. If you have problems or questions, call your doctor. Follow these instructions at home: General instructions   For the first 24 hours after the procedure: ? Do not drive or use machinery. ? Do not sign important documents. ? Do not drink alcohol. ? Do your daily activities more slowly than normal. ? Eat foods that are soft and easy to digest. ? Rest often.  Take over-the-counter or prescription medicines only as told by your doctor.  It is up to you to get the results of your procedure. Ask your doctor, or the department performing the procedure, when your results will be ready. To help cramping and bloating:  Try walking around.  Put heat on your belly (abdomen) as told by your doctor. Use a heat source that your doctor recommends, such as a moist heat pack or a heating pad. ? Put a towel between your skin and the heat source. ? Leave the heat on for 20-30 minutes. ? Remove the heat if your skin turns bright red. This is especially important if you cannot feel pain, heat, or cold. You can get burned. Eating and drinking  Drink enough fluid to keep your pee (urine) clear or pale yellow.  Return to your normal diet as told by your doctor. Avoid heavy or fried foods that are hard to digest.  Avoid drinking alcohol for as long as told by your doctor. Contact a doctor if:  You have blood in your poop (stool) 2-3 days after the procedure. Get help right away if:  You have more than a small amount of blood in your poop.  You see large clumps of tissue (blood clots) in your poop.  Your belly is swollen.  You feel sick to your stomach  (nauseous).  You throw up (vomit).  You have a fever.  You have belly pain that gets worse, and medicine does not help your pain. This information is not intended to replace advice given to you by your health care provider. Make sure you discuss any questions you have with your health care provider. Document Released: 10/26/2010 Document Revised: 06/17/2016 Document Reviewed: 06/17/2016 Elsevier Interactive Patient Education  2017 [employer]  Hemorrhoids Hemorrhoids are swollen veins in and around the rectum or anus. There are two types of hemorrhoids:  Internal hemorrhoids. These occur in the veins that are just inside the rectum. They may poke through to the outside and become irritated and painful.  External hemorrhoids. These occur in the veins that are outside of the anus and can be felt as a painful swelling or hard lump near the anus.  Most hemorrhoids do not cause serious problems, and they can be managed with home treatments such as diet and lifestyle changes. If home treatments do not help your symptoms, procedures can be done to shrink or remove the hemorrhoids. What are the causes? This condition is caused by increased pressure in the anal area. This pressure may result from various things, including:  Constipation.  Straining to have a bowel movement.  Diarrhea.  Pregnancy.  Obesity.  Sitting for long periods of time.  Heavy lifting or other activity that causes you to strain.  Anal sex.  What are the signs or symptoms? Symptoms of this condition include:  Pain.  Anal itching or irritation.  Rectal bleeding.  Leakage of stool (feces).  Anal swelling.  One or more lumps around the anus.  How is this diagnosed? This condition can often be diagnosed through a visual exam. Other exams or tests may also be done, such as:  Examination of the rectal area with a gloved hand (digital rectal exam).  Examination of the anal canal using a small tube  (anoscope).  A blood test, if you have lost a significant amount of blood.  A test to look inside the colon (sigmoidoscopy or colonoscopy).  How is this treated? This condition can usually be treated at home. However, various procedures may be done if dietary changes, lifestyle changes, and other home treatments do not help your symptoms. These procedures can help make the hemorrhoids smaller or remove them completely. Some of these procedures involve surgery, and others do not. Common procedures include:  Rubber band ligation. Rubber bands are placed at the base of the hemorrhoids to cut off the blood supply to them.  Sclerotherapy. Medicine is injected into the hemorrhoids to shrink them.  Infrared coagulation. A type of light energy is used to get rid of the hemorrhoids.  Hemorrhoidectomy surgery. The hemorrhoids are surgically removed, and the veins that supply them are tied off.  Stapled hemorrhoidopexy surgery. A circular stapling device is used to remove the hemorrhoids and use staples to cut off the blood supply to them.  Follow these instructions at home: Eating and drinking  Eat foods that have a lot of fiber in them, such as whole grains, beans, nuts, fruits, and vegetables. Ask your health care provider about taking products that have added fiber (fiber supplements).  Drink enough fluid to keep your urine clear or pale yellow. Managing pain and swelling  Take warm sitz baths for 20 minutes, 3-4 times a day to ease pain and discomfort.  If directed, apply ice to the affected area. Using ice packs between sitz baths may be helpful. ? Put ice in a plastic bag. ? Place a towel between your skin and the bag. ? Leave the ice on for 20 minutes, 2-3 times a day. General instructions  Take over-the-counter and prescription medicines only as told by your health care provider.  Use medicated creams or suppositories as told.  Exercise regularly.  Go to the bathroom when you  have the urge to have a bowel movement. Do not wait.  Avoid straining to have bowel movements.  Keep the anal area dry and clean. Use wet toilet paper or moist towelettes after a bowel movement.  Do not sit on the toilet for long periods of time. This increases blood pooling and pain. Contact a health care provider if:  You have increasing pain and swelling that are not controlled by treatment or medicine.  You have uncontrolled bleeding.  You have difficulty having a bowel movement, or you are unable to have a bowel movement.  You have pain or inflammation outside the area of the hemorrhoids. This information is not intended to replace advice given to you by your health care provider. Make sure you discuss any questions you have with your health care provider. Document Released: 09/20/2000 Document Revised: 02/21/2016 Document Reviewed: 06/07/2015 Elsevier Interactive Patient Education  2018 [employer].  Diverticulosis Diverticulosis is a condition that develops when small pouches (diverticula) form in the wall of the large intestine (colon). The colon is where water  is absorbed and stool is formed. The pouches form when the inside layer of the colon pushes through weak spots in the outer layers of the colon. You may have a few pouches or many of them. What are the causes? The cause of this condition is not known. What increases the risk? The following factors may make you more likely to develop this condition:  Being older than age 29. Your risk for this condition increases with age. Diverticulosis is rare among people younger than age 8. By age 67, many people have it.  Eating a low-fiber diet.  Having frequent constipation.  Being overweight.  Not getting enough exercise.  Smoking.  Taking over-the-counter pain medicines, like aspirin and ibuprofen.  Having a family history of diverticulosis.  What are the signs or symptoms? In most people, there are no symptoms of  this condition. If you do have symptoms, they may include:  Bloating.  Cramps in the abdomen.  Constipation or diarrhea.  Pain in the lower left side of the abdomen.  How is this diagnosed? This condition is most often diagnosed during an exam for other colon problems. Because diverticulosis usually has no symptoms, it often cannot be diagnosed independently. This condition may be diagnosed by:  Using a flexible scope to examine the colon (colonoscopy).  Taking an X-ray of the colon after dye has been put into the colon (barium enema).  Doing a CT scan.  How is this treated? You may not need treatment for this condition if you have never developed an infection related to diverticulosis. If you have had an infection before, treatment may include:  Eating a high-fiber diet. This may include eating more fruits, vegetables, and grains.  Taking a fiber supplement.  Taking a live bacteria supplement (probiotic).  Taking medicine to relax your colon.  Taking antibiotic medicines.  Follow these instructions at home:  Drink 6-8 glasses of water or more each day to prevent constipation.  Try not to strain when you have a bowel movement.  If you have had an infection before: ? Eat more fiber as directed by your health care provider or your diet and nutrition specialist (dietitian). ? Take a fiber supplement or probiotic, if your health care provider approves.  Take over-the-counter and prescription medicines only as told by your health care provider.  If you were prescribed an antibiotic, take it as told by your health care provider. Do not stop taking the antibiotic even if you start to feel better.  Keep all follow-up visits as told by your health care provider. This is important. Contact a health care provider if:  You have pain in your abdomen.  You have bloating.  You have cramps.  You have not had a bowel movement in 3 days. Get help right away if:  Your pain gets  worse.  Your bloating becomes very bad.  You have a fever or chills, and your symptoms suddenly get worse.  You vomit.  You have bowel movements that are bloody or black.  You have bleeding from your rectum. Summary  Diverticulosis is a condition that develops when small pouches (diverticula) form in the wall of the large intestine (colon).  You may have a few pouches or many of them.  This condition is most often diagnosed during an exam for other colon problems.  If you have had an infection related to diverticulosis, treatment may include increasing the fiber in your diet, taking supplements, or taking medicines. This information is not intended to  replace advice given to you by your health care provider. Make sure you discuss any questions you have with your health care provider. Document Released: 06/20/2004 Document Revised: 08/12/2016 Document Reviewed: 08/12/2016 Elsevier Interactive Patient Education  2017 [place].  Colon Polyps Polyps are tissue growths inside the body. Polyps can grow in many places, including the large intestine (colon). A polyp may be a round bump or a mushroom-shaped growth. You could have one polyp or several. Most colon polyps are noncancerous (benign). However, some colon polyps can become cancerous over time. What are the causes? The exact cause of colon polyps is not known. What increases the risk? This condition is more likely to develop in people who:  Have a family history of colon cancer or colon polyps.  Are older than 10 or older than 45 if they are African American.  Have inflammatory bowel disease, such as ulcerative colitis or Crohn disease.  Are overweight.  Smoke cigarettes.  Do not get enough exercise.  Drink too much alcohol.  Eat a diet that is: ? High in fat and red meat. ? Low in fiber.  Had childhood cancer that was treated with abdominal radiation.  What are the signs or symptoms? Most polyps do not cause  symptoms. If you have symptoms, they may include:  Blood coming from your rectum when having a bowel movement.  Blood in your stool.The stool may look dark red or black.  A change in bowel habits, such as constipation or diarrhea.  How is this diagnosed? This condition is diagnosed with a colonoscopy. This is a procedure that uses a lighted, flexible scope to look at the inside of your colon. How is this treated? Treatment for this condition involves removing any polyps that are found. Those polyps will then be tested for cancer. If cancer is found, your health care provider will talk to you about options for colon cancer treatment. Follow these instructions at home: Diet  Eat plenty of fiber, such as fruits, vegetables, and whole grains.  Eat foods that are high in calcium and vitamin D, such as milk, cheese, yogurt, eggs, liver, fish, and broccoli.  Limit foods high in fat, red meats, and processed meats, such as hot dogs, sausage, bacon, and lunch meats.  Maintain a healthy weight, or lose weight if recommended by your health care provider. General instructions  Do not smoke cigarettes.  Do not drink alcohol excessively.  Keep all follow-up visits as told by your health care provider. This is important. This includes keeping regularly scheduled colonoscopies. Talk to your health care provider about when you need a colonoscopy.  Exercise every day or as told by your health care provider. Contact a health care provider if:  You have new or worsening bleeding during a bowel movement.  You have new or increased blood in your stool.  You have a change in bowel habits.  You unexpectedly lose weight. This information is not intended to replace advice given to you by your health care provider. Make sure you discuss any questions you have with your health care provider. Document Released: 06/19/2004 Document Revised: 02/29/2016 Document Reviewed: 08/14/2015 Elsevier Interactive  Patient Education  [employer].

## 2017-10-17 ENCOUNTER — Encounter (HOSPITAL_COMMUNITY): Payer: Self-pay | Admitting: Internal Medicine

## 2017-10-28 DIAGNOSIS — Z96653 Presence of artificial knee joint, bilateral: Secondary | ICD-10-CM | POA: Diagnosis not present

## 2017-10-31 DIAGNOSIS — Z961 Presence of intraocular lens: Secondary | ICD-10-CM | POA: Diagnosis not present

## 2017-11-03 DIAGNOSIS — Z6827 Body mass index (BMI) 27.0-27.9, adult: Secondary | ICD-10-CM | POA: Diagnosis not present

## 2017-11-03 DIAGNOSIS — R002 Palpitations: Secondary | ICD-10-CM | POA: Diagnosis not present

## 2017-12-01 ENCOUNTER — Telehealth (INDEPENDENT_AMBULATORY_CARE_PROVIDER_SITE_OTHER): Payer: Self-pay | Admitting: *Deleted

## 2017-12-01 ENCOUNTER — Other Ambulatory Visit (INDEPENDENT_AMBULATORY_CARE_PROVIDER_SITE_OTHER): Payer: Self-pay | Admitting: Internal Medicine

## 2017-12-01 NOTE — Telephone Encounter (Signed)
Patient called to schedule CT -- need to verify -- CT pelvis w contrast for appendical polyp?

## 2017-12-01 NOTE — Telephone Encounter (Signed)
She had appendiceal base. Need to make sure her appendix is normal.

## 2017-12-02 ENCOUNTER — Other Ambulatory Visit (INDEPENDENT_AMBULATORY_CARE_PROVIDER_SITE_OTHER): Payer: Self-pay | Admitting: Internal Medicine

## 2017-12-02 DIAGNOSIS — D373 Neoplasm of uncertain behavior of appendix: Secondary | ICD-10-CM

## 2017-12-02 DIAGNOSIS — K383 Fistula of appendix: Secondary | ICD-10-CM

## 2017-12-02 NOTE — Telephone Encounter (Signed)
CT sch'd 12/16/16 at 300 (245), patient aware

## 2017-12-02 NOTE — Addendum Note (Signed)
Addended by: Rogene Houston on: 12/02/2017 10:20 AM   Modules accepted: Orders

## 2017-12-02 NOTE — Addendum Note (Signed)
Addended by: Rogene Houston on: 12/02/2017 10:54 AM   Modules accepted: Orders

## 2017-12-16 ENCOUNTER — Ambulatory Visit (HOSPITAL_COMMUNITY)
Admission: RE | Admit: 2017-12-16 | Discharge: 2017-12-16 | Disposition: A | Discharge status: Home / Self Care | Payer: Medicare Other | Source: Ambulatory Visit | Attending: Internal Medicine | Admitting: Internal Medicine

## 2017-12-16 ENCOUNTER — Ambulatory Visit (HOSPITAL_COMMUNITY): Payer: Medicare Other

## 2017-12-16 DIAGNOSIS — D373 Neoplasm of uncertain behavior of appendix: Secondary | ICD-10-CM | POA: Insufficient documentation

## 2017-12-16 DIAGNOSIS — K4090 Unilateral inguinal hernia, without obstruction or gangrene, not specified as recurrent: Secondary | ICD-10-CM | POA: Diagnosis not present

## 2017-12-16 DIAGNOSIS — M4316 Spondylolisthesis, lumbar region: Secondary | ICD-10-CM | POA: Diagnosis not present

## 2017-12-16 DIAGNOSIS — I700 Atherosclerosis of aorta: Secondary | ICD-10-CM | POA: Insufficient documentation

## 2017-12-16 LAB — POCT I-STAT CREATININE: Creatinine, Ser: 0.7 mg/dL (ref 0.44–1.00)

## 2017-12-16 MED ORDER — IOPAMIDOL (ISOVUE-300) INJECTION 61%
100.0000 mL | Freq: Once | INTRAVENOUS | Status: AC | PRN
  Administered 2017-12-16: 100 mL via INTRAVENOUS

## 2018-03-10 DIAGNOSIS — M25531 Pain in right wrist: Secondary | ICD-10-CM | POA: Diagnosis not present

## 2018-03-24 DIAGNOSIS — R2231 Localized swelling, mass and lump, right upper limb: Secondary | ICD-10-CM | POA: Diagnosis not present

## 2018-04-23 ENCOUNTER — Other Ambulatory Visit (HOSPITAL_COMMUNITY): Payer: Self-pay | Admitting: Internal Medicine

## 2018-04-23 DIAGNOSIS — Z1231 Encounter for screening mammogram for malignant neoplasm of breast: Secondary | ICD-10-CM

## 2018-04-28 DIAGNOSIS — R2231 Localized swelling, mass and lump, right upper limb: Secondary | ICD-10-CM | POA: Diagnosis not present

## 2018-04-30 ENCOUNTER — Ambulatory Visit (HOSPITAL_COMMUNITY)
Admission: RE | Admit: 2018-04-30 | Discharge: 2018-04-30 | Disposition: A | Discharge status: Home / Self Care | Payer: Medicare Other | Source: Ambulatory Visit | Attending: Internal Medicine | Admitting: Internal Medicine

## 2018-04-30 DIAGNOSIS — Z1231 Encounter for screening mammogram for malignant neoplasm of breast: Secondary | ICD-10-CM | POA: Diagnosis not present

## 2018-07-16 DIAGNOSIS — Z23 Encounter for immunization: Secondary | ICD-10-CM | POA: Diagnosis not present

## 2018-08-13 DIAGNOSIS — R002 Palpitations: Secondary | ICD-10-CM | POA: Diagnosis not present

## 2018-08-13 DIAGNOSIS — K219 Gastro-esophageal reflux disease without esophagitis: Secondary | ICD-10-CM | POA: Diagnosis not present

## 2018-08-13 DIAGNOSIS — Z79899 Other long term (current) drug therapy: Secondary | ICD-10-CM | POA: Diagnosis not present

## 2018-08-13 DIAGNOSIS — I10 Essential (primary) hypertension: Secondary | ICD-10-CM | POA: Diagnosis not present

## 2018-08-20 DIAGNOSIS — K219 Gastro-esophageal reflux disease without esophagitis: Secondary | ICD-10-CM | POA: Diagnosis not present

## 2018-08-20 DIAGNOSIS — E039 Hypothyroidism, unspecified: Secondary | ICD-10-CM | POA: Diagnosis not present

## 2018-08-20 DIAGNOSIS — I493 Ventricular premature depolarization: Secondary | ICD-10-CM | POA: Diagnosis not present

## 2018-08-20 DIAGNOSIS — I700 Atherosclerosis of aorta: Secondary | ICD-10-CM | POA: Diagnosis not present

## 2018-11-02 DIAGNOSIS — Z961 Presence of intraocular lens: Secondary | ICD-10-CM | POA: Diagnosis not present

## 2018-11-02 DIAGNOSIS — H04123 Dry eye syndrome of bilateral lacrimal glands: Secondary | ICD-10-CM | POA: Diagnosis not present

## 2018-11-03 DIAGNOSIS — Z96653 Presence of artificial knee joint, bilateral: Secondary | ICD-10-CM | POA: Diagnosis not present

## 2018-11-03 DIAGNOSIS — R269 Unspecified abnormalities of gait and mobility: Secondary | ICD-10-CM | POA: Diagnosis not present

## 2018-11-03 DIAGNOSIS — R2689 Other abnormalities of gait and mobility: Secondary | ICD-10-CM | POA: Diagnosis not present

## 2018-11-24 DIAGNOSIS — E039 Hypothyroidism, unspecified: Secondary | ICD-10-CM | POA: Diagnosis not present

## 2018-12-01 DIAGNOSIS — Z6826 Body mass index (BMI) 26.0-26.9, adult: Secondary | ICD-10-CM | POA: Diagnosis not present

## 2018-12-01 DIAGNOSIS — E039 Hypothyroidism, unspecified: Secondary | ICD-10-CM | POA: Diagnosis not present

## 2018-12-01 DIAGNOSIS — J310 Chronic rhinitis: Secondary | ICD-10-CM | POA: Diagnosis not present

## 2019-07-29 DIAGNOSIS — Z23 Encounter for immunization: Secondary | ICD-10-CM | POA: Diagnosis not present

## 2019-08-30 DIAGNOSIS — E039 Hypothyroidism, unspecified: Secondary | ICD-10-CM | POA: Diagnosis not present

## 2019-08-30 DIAGNOSIS — K219 Gastro-esophageal reflux disease without esophagitis: Secondary | ICD-10-CM | POA: Diagnosis not present

## 2019-08-30 DIAGNOSIS — R7301 Impaired fasting glucose: Secondary | ICD-10-CM | POA: Diagnosis not present

## 2019-08-30 DIAGNOSIS — E785 Hyperlipidemia, unspecified: Secondary | ICD-10-CM | POA: Diagnosis not present

## 2019-08-30 DIAGNOSIS — Z79899 Other long term (current) drug therapy: Secondary | ICD-10-CM | POA: Diagnosis not present

## 2019-08-30 DIAGNOSIS — I10 Essential (primary) hypertension: Secondary | ICD-10-CM | POA: Diagnosis not present

## 2019-08-30 DIAGNOSIS — M1990 Unspecified osteoarthritis, unspecified site: Secondary | ICD-10-CM | POA: Diagnosis not present

## 2019-08-30 DIAGNOSIS — R002 Palpitations: Secondary | ICD-10-CM | POA: Diagnosis not present

## 2019-09-06 DIAGNOSIS — E039 Hypothyroidism, unspecified: Secondary | ICD-10-CM | POA: Diagnosis not present

## 2019-09-06 DIAGNOSIS — K219 Gastro-esophageal reflux disease without esophagitis: Secondary | ICD-10-CM | POA: Diagnosis not present

## 2019-09-06 DIAGNOSIS — I493 Ventricular premature depolarization: Secondary | ICD-10-CM | POA: Diagnosis not present

## 2019-10-29 ENCOUNTER — Ambulatory Visit: Payer: Medicare Other | Attending: Internal Medicine

## 2019-10-29 DIAGNOSIS — Z23 Encounter for immunization: Secondary | ICD-10-CM | POA: Insufficient documentation

## 2019-10-29 NOTE — Progress Notes (Signed)
   Covid-19 Vaccination Clinic  Name:  Evelyn Walker    MRN: [id number] DOB: [birth year]  10/29/2019  [name] was observed post Covid-19 immunization for 15 minutes without incidence. She was provided with Vaccine Information Sheet and instruction to access the V-Safe system.   [name] was instructed to call 911 with any severe reactions post vaccine: Marland Kitchen Difficulty breathing  . Swelling of your face and throat  . A fast heartbeat  . A bad rash all over your body  . Dizziness and weakness    Immunizations Administered    Name Date Dose VIS Date Route   Pfizer COVID-19 Vaccine 10/29/2019  5:00 PM 0.3 mL 09/17/2019 Intramuscular   Manufacturer: [place]   Lot: [id number]   [place]: [id number]

## 2019-11-03 DIAGNOSIS — Z961 Presence of intraocular lens: Secondary | ICD-10-CM | POA: Diagnosis not present

## 2019-11-19 ENCOUNTER — Ambulatory Visit: Payer: Medicare Other | Attending: Internal Medicine

## 2019-11-19 DIAGNOSIS — Z23 Encounter for immunization: Secondary | ICD-10-CM | POA: Insufficient documentation

## 2019-11-19 NOTE — Progress Notes (Signed)
   Covid-19 Vaccination Clinic  Name:  Evelyn Walker    MRN: [id number] DOB: 1935/08/29  11/19/2019  [name] was observed post Covid-19 immunization for 15 minutes without incidence. She was provided with Vaccine Information Sheet and instruction to access the V-Safe system.   [name] was instructed to call 911 with any severe reactions post vaccine: Marland Kitchen Difficulty breathing  . Swelling of your face and throat  . A fast heartbeat  . A bad rash all over your body  . Dizziness and weakness    Immunizations Administered    Name Date Dose VIS Date Route   Pfizer COVID-19 Vaccine 11/19/2019  4:15 PM 0.3 mL 09/17/2019 Intramuscular   Manufacturer: [place]   Lot: [id number]   [place]: [id number]

## 2020-02-10 DIAGNOSIS — M79674 Pain in right toe(s): Secondary | ICD-10-CM | POA: Diagnosis not present

## 2020-02-10 DIAGNOSIS — S93331A Other subluxation of right foot, initial encounter: Secondary | ICD-10-CM | POA: Diagnosis not present

## 2020-02-10 DIAGNOSIS — L110 Acquired keratosis follicularis: Secondary | ICD-10-CM | POA: Diagnosis not present

## 2020-02-10 DIAGNOSIS — M79671 Pain in right foot: Secondary | ICD-10-CM | POA: Diagnosis not present

## 2020-02-10 DIAGNOSIS — M779 Enthesopathy, unspecified: Secondary | ICD-10-CM | POA: Diagnosis not present

## 2020-07-06 ENCOUNTER — Ambulatory Visit: Payer: Medicare Other | Attending: Internal Medicine

## 2020-07-06 DIAGNOSIS — Z23 Encounter for immunization: Secondary | ICD-10-CM

## 2020-07-06 NOTE — Progress Notes (Signed)
   Covid-19 Vaccination Clinic  Name:  Evelyn Walker    MRN: [id number] DOB: 01-07-1935  07/06/2020  Evelyn Walker was observed post Covid-19 immunization for 15 minutes without incident. She was provided with Vaccine Information Sheet and instruction to access the V-Safe system.   Evelyn Walker was instructed to call 911 with any severe reactions post vaccine: Marland Kitchen Difficulty breathing  . Swelling of face and throat  . A fast heartbeat  . A bad rash all over body  . Dizziness and weakness

## 2020-07-11 DIAGNOSIS — E039 Hypothyroidism, unspecified: Secondary | ICD-10-CM | POA: Diagnosis not present

## 2020-07-11 DIAGNOSIS — Z79899 Other long term (current) drug therapy: Secondary | ICD-10-CM | POA: Diagnosis not present

## 2020-07-13 DIAGNOSIS — E039 Hypothyroidism, unspecified: Secondary | ICD-10-CM | POA: Diagnosis not present

## 2020-07-13 DIAGNOSIS — F329 Major depressive disorder, single episode, unspecified: Secondary | ICD-10-CM | POA: Diagnosis not present

## 2020-08-14 DIAGNOSIS — F329 Major depressive disorder, single episode, unspecified: Secondary | ICD-10-CM | POA: Diagnosis not present

## 2020-08-14 DIAGNOSIS — Z23 Encounter for immunization: Secondary | ICD-10-CM | POA: Diagnosis not present

## 2020-08-28 DIAGNOSIS — Z79899 Other long term (current) drug therapy: Secondary | ICD-10-CM | POA: Diagnosis not present

## 2020-08-28 DIAGNOSIS — M1990 Unspecified osteoarthritis, unspecified site: Secondary | ICD-10-CM | POA: Diagnosis not present

## 2020-08-28 DIAGNOSIS — E039 Hypothyroidism, unspecified: Secondary | ICD-10-CM | POA: Diagnosis not present

## 2020-08-28 DIAGNOSIS — I700 Atherosclerosis of aorta: Secondary | ICD-10-CM | POA: Diagnosis not present

## 2020-08-28 DIAGNOSIS — I10 Essential (primary) hypertension: Secondary | ICD-10-CM | POA: Diagnosis not present

## 2020-08-28 DIAGNOSIS — R7301 Impaired fasting glucose: Secondary | ICD-10-CM | POA: Diagnosis not present

## 2020-08-28 DIAGNOSIS — E785 Hyperlipidemia, unspecified: Secondary | ICD-10-CM | POA: Diagnosis not present

## 2020-08-28 DIAGNOSIS — K219 Gastro-esophageal reflux disease without esophagitis: Secondary | ICD-10-CM | POA: Diagnosis not present

## 2020-08-29 DIAGNOSIS — T50A15A Adverse effect of pertussis vaccine, including combinations with a pertussis component, initial encounter: Secondary | ICD-10-CM | POA: Diagnosis not present

## 2020-08-29 DIAGNOSIS — S41111A Laceration without foreign body of right upper arm, initial encounter: Secondary | ICD-10-CM | POA: Diagnosis not present

## 2020-08-29 DIAGNOSIS — Z23 Encounter for immunization: Secondary | ICD-10-CM | POA: Diagnosis not present

## 2020-08-29 DIAGNOSIS — S40021A Contusion of right upper arm, initial encounter: Secondary | ICD-10-CM | POA: Diagnosis not present

## 2020-09-07 DIAGNOSIS — I700 Atherosclerosis of aorta: Secondary | ICD-10-CM | POA: Diagnosis not present

## 2020-09-07 DIAGNOSIS — E039 Hypothyroidism, unspecified: Secondary | ICD-10-CM | POA: Diagnosis not present

## 2020-09-07 DIAGNOSIS — Z6824 Body mass index (BMI) 24.0-24.9, adult: Secondary | ICD-10-CM | POA: Diagnosis not present

## 2020-09-07 DIAGNOSIS — R030 Elevated blood-pressure reading, without diagnosis of hypertension: Secondary | ICD-10-CM | POA: Diagnosis not present

## 2020-09-07 DIAGNOSIS — Z0001 Encounter for general adult medical examination with abnormal findings: Secondary | ICD-10-CM | POA: Diagnosis not present

## 2020-09-07 DIAGNOSIS — I493 Ventricular premature depolarization: Secondary | ICD-10-CM | POA: Diagnosis not present

## 2020-09-07 DIAGNOSIS — R002 Palpitations: Secondary | ICD-10-CM | POA: Diagnosis not present

## 2020-11-02 DIAGNOSIS — Z961 Presence of intraocular lens: Secondary | ICD-10-CM | POA: Diagnosis not present

## 2020-11-09 DIAGNOSIS — F329 Major depressive disorder, single episode, unspecified: Secondary | ICD-10-CM | POA: Diagnosis not present

## 2020-11-30 ENCOUNTER — Encounter (INDEPENDENT_AMBULATORY_CARE_PROVIDER_SITE_OTHER): Payer: Self-pay | Admitting: *Deleted

## 2021-01-09 ENCOUNTER — Telehealth (INDEPENDENT_AMBULATORY_CARE_PROVIDER_SITE_OTHER): Payer: Self-pay | Admitting: *Deleted

## 2021-01-09 ENCOUNTER — Encounter (INDEPENDENT_AMBULATORY_CARE_PROVIDER_SITE_OTHER): Payer: Self-pay | Admitting: *Deleted

## 2021-01-09 ENCOUNTER — Other Ambulatory Visit (INDEPENDENT_AMBULATORY_CARE_PROVIDER_SITE_OTHER): Payer: Self-pay | Admitting: *Deleted

## 2021-01-09 NOTE — Telephone Encounter (Signed)
Patient needs suprep 

## 2021-01-09 NOTE — Telephone Encounter (Signed)
Referring MD/PCP: fagan   Procedure: tcs  Reason/Indication:  Hx polyps  Has patient had this procedure before?  Yes, 2019  If so, when, by whom and where?    Is there a family history of colon cancer?  no  Who?  What age when diagnosed?    Is patient diabetic?   no      Does patient have prosthetic heart valve or mechanical valve?  no  Do you have a pacemaker/defibrillator?  no  Has patient ever had endocarditis/atrial fibrillation? no  Have you had a stroke/heart attack last 6 mths? no  Does patient use oxygen? no  Has patient had joint replacement within last 12 months?  no  Is patient constipated or do they take laxatives? no  Does patient have a history of alcohol/drug use?  no  Is patient on blood thinner such as Coumadin, Plavix and/or Aspirin? no  Do you take medicine for weight loss. no  Medications: levothyroxine 150 mcg daily, omeprazole 40 mg daily, metoprolol 25 mg daily, mirtazapine 15 mg 1/2 tab at bedtime, tylenol/ibuprofen prn, vit c 500 mg daily, zinc 50 mg daily, CoQ 10 100 mg daily, vit d3 5000 iu daily, biotin daily, krill oil 500 mg daily, zyrtec 10 mg daily,   Allergies: knda  Medication Adjustment per Dr Rehman/Dr Jenetta Downer   Procedure date & time: 02/01/21

## 2021-01-10 ENCOUNTER — Other Ambulatory Visit (INDEPENDENT_AMBULATORY_CARE_PROVIDER_SITE_OTHER): Payer: Self-pay

## 2021-01-10 DIAGNOSIS — Z86010 Personal history of colonic polyps: Secondary | ICD-10-CM

## 2021-01-10 MED ORDER — SUPREP BOWEL PREP KIT 17.5-3.13-1.6 GM/177ML PO SOLN: 1.0000 | Freq: Once | ORAL | 0 refills | Status: AC

## 2021-01-10 NOTE — Telephone Encounter (Signed)
Done

## 2021-01-29 ENCOUNTER — Encounter (HOSPITAL_COMMUNITY): Payer: Self-pay | Admitting: Internal Medicine

## 2021-01-30 ENCOUNTER — Other Ambulatory Visit (HOSPITAL_COMMUNITY)
Admission: RE | Admit: 2021-01-30 | Discharge: 2021-01-30 | Disposition: A | Discharge status: Home / Self Care | Payer: Medicare Other | Source: Ambulatory Visit | Attending: Internal Medicine | Admitting: Internal Medicine

## 2021-01-30 ENCOUNTER — Other Ambulatory Visit: Payer: Self-pay

## 2021-01-30 DIAGNOSIS — Z20822 Contact with and (suspected) exposure to covid-19: Secondary | ICD-10-CM | POA: Insufficient documentation

## 2021-01-30 DIAGNOSIS — Z01812 Encounter for preprocedural laboratory examination: Secondary | ICD-10-CM | POA: Diagnosis not present

## 2021-01-31 LAB — SARS CORONAVIRUS 2 (TAT 6-24 HRS): SARS Coronavirus 2: NEGATIVE

## 2021-02-01 ENCOUNTER — Ambulatory Visit (HOSPITAL_COMMUNITY)
Admission: RE | Admit: 2021-02-01 | Discharge: 2021-02-01 | Disposition: A | Discharge status: Home / Self Care | Payer: Medicare Other | Attending: Internal Medicine | Admitting: Internal Medicine

## 2021-02-01 ENCOUNTER — Other Ambulatory Visit: Payer: Self-pay

## 2021-02-01 ENCOUNTER — Encounter (HOSPITAL_COMMUNITY): Payer: Self-pay | Admitting: Internal Medicine

## 2021-02-01 ENCOUNTER — Encounter (HOSPITAL_COMMUNITY)
Admission: RE | Disposition: A | Discharge status: Home / Self Care | Payer: Self-pay | Source: Home / Self Care | Attending: Internal Medicine

## 2021-02-01 DIAGNOSIS — Z9119 Patient's noncompliance with other medical treatment and regimen: Secondary | ICD-10-CM | POA: Diagnosis not present

## 2021-02-01 DIAGNOSIS — K5730 Diverticulosis of large intestine without perforation or abscess without bleeding: Secondary | ICD-10-CM | POA: Diagnosis not present

## 2021-02-01 DIAGNOSIS — Z538 Procedure and treatment not carried out for other reasons: Secondary | ICD-10-CM | POA: Diagnosis not present

## 2021-02-01 DIAGNOSIS — Z87891 Personal history of nicotine dependence: Secondary | ICD-10-CM | POA: Insufficient documentation

## 2021-02-01 DIAGNOSIS — Z09 Encounter for follow-up examination after completed treatment for conditions other than malignant neoplasm: Secondary | ICD-10-CM | POA: Diagnosis not present

## 2021-02-01 DIAGNOSIS — Z79899 Other long term (current) drug therapy: Secondary | ICD-10-CM | POA: Insufficient documentation

## 2021-02-01 DIAGNOSIS — Z86010 Personal history of colonic polyps: Secondary | ICD-10-CM | POA: Insufficient documentation

## 2021-02-01 DIAGNOSIS — Z79890 Hormone replacement therapy (postmenopausal): Secondary | ICD-10-CM | POA: Diagnosis not present

## 2021-02-01 DIAGNOSIS — Z96653 Presence of artificial knee joint, bilateral: Secondary | ICD-10-CM | POA: Diagnosis not present

## 2021-02-01 HISTORY — PX: COLONOSCOPY: SHX5424

## 2021-02-01 SURGERY — COLONOSCOPY
Anesthesia: Moderate Sedation

## 2021-02-01 MED ORDER — SODIUM CHLORIDE 0.9 % IV SOLN: INTRAVENOUS | Status: DC

## 2021-02-01 MED ORDER — MEPERIDINE HCL 50 MG/ML IJ SOLN
INTRAMUSCULAR | Status: DC | PRN
  Administered 2021-02-01: 10 mg via INTRAVENOUS
  Administered 2021-02-01 (×2): 20 mg via INTRAVENOUS

## 2021-02-01 MED ORDER — MEPERIDINE HCL 50 MG/ML IJ SOLN
INTRAMUSCULAR | Status: AC
  Filled 2021-02-01: qty 1

## 2021-02-01 MED ORDER — STERILE WATER FOR IRRIGATION IR SOLN: Status: DC | PRN

## 2021-02-01 MED ORDER — MIDAZOLAM HCL 5 MG/5ML IJ SOLN
INTRAMUSCULAR | Status: AC
  Filled 2021-02-01: qty 10

## 2021-02-01 MED ORDER — MIDAZOLAM HCL 5 MG/5ML IJ SOLN
INTRAMUSCULAR | Status: DC | PRN
  Administered 2021-02-01: 2 mg via INTRAVENOUS
  Administered 2021-02-01 (×3): 1 mg via INTRAVENOUS

## 2021-02-01 NOTE — H&P (Signed)
Evelyn Walker is an 86 y.o. female.   Chief Complaint: Patient is here for colonoscopy. HPI: Patient is 86 year old Caucasian female who is here for surveillance colonoscopy.  She had about 10 mm polyp at appendiceal edge.  It was biopsied and treated with APC.  Histology revealed sessile serrated polyp.  She return for repeat exam in January 2019 and she had local recurrence.  This polyp was biopsied and ablated with APC.  She subsequently also had CT and there was no evidence of enlarged appendix.  She denies melena or rectal bleeding.  She says she has been under a lot of stress and at times as diarrhea and urgency but no bleeding.  She takes Kaopectate.  She recently lost her son-in-law who was 29 years old. Her family history is negative for colon carcinoma. Both her parents died young.  Mother died of stroke at age 24 and father was in his 100s.   Past Medical History:  Diagnosis Date  . Arthritis   . Essential hypertension   . GERD (gastroesophageal reflux disease)   . Hypothyroidism   . PONV (postoperative nausea and vomiting)    ? whether morphine or anesthesia  . Primary localized osteoarthritis of left knee   . Rhinitis     Past Surgical History:  Procedure Laterality Date  . CATARACT EXTRACTION Bilateral   . COLONOSCOPY N/A 07/27/2014   Procedure: COLONOSCOPY;  Surgeon: Rogene Houston, MD;  [facility];  Service: Endoscopy;  Laterality: N/A;  100  . COLONOSCOPY N/A 10/15/2017   Procedure: COLONOSCOPY;  Surgeon: Rogene Houston, MD;  [facility]: [facility];  Service: Endoscopy;  Laterality: N/A;  1030  . HOT HEMOSTASIS  10/15/2017   Procedure: HOT HEMOSTASIS (ARGON PLASMA COAGULATION/BICAP);  Surgeon: Rogene Houston, MD;  [facility]: [facility];  Service: Endoscopy;;  CECUM  . POLYPECTOMY  10/15/2017   Procedure: POLYPECTOMY;  Surgeon: Rogene Houston, MD;  [facility];  Service: Endoscopy;;  colon  . TOTAL KNEE ARTHROPLASTY Right 2004  . TOTAL KNEE  ARTHROPLASTY Left 07/31/2015   Procedure: TOTAL KNEE ARTHROPLASTY;  Surgeon: Elsie Saas, MD;  Location: [facility];  Service: Orthopedics;  Laterality: Left;    Family History  Problem Relation Age of Onset  . Stroke Mother   . Heart attack Father   . Breast cancer Paternal Grandfather   . Diabetes Paternal Grandfather   . Colon cancer Neg Hx    Social History:  reports that she quit smoking about 34 years ago. Her smoking use included cigarettes. She started smoking about 63 years ago. She has a 2.50 pack-year smoking history. She has never used smokeless tobacco. She reports current alcohol use of about 3.0 standard drinks of alcohol per week. She reports that she does not use drugs.  Allergies: No Known Allergies  Medications Prior to Admission  Medication Sig Dispense Refill  . acetaminophen (TYLENOL) 500 MG tablet Take 1,000 mg by mouth 2 (two) times daily as needed for moderate pain.    Marland Kitchen amoxicillin (AMOXIL) 500 MG capsule Take 500 mg by mouth See admin instructions. Take 4 capsules (2000 mg) by mouth one hour prior to dental appointments    . Biotin 5000 MCG CAPS Take 5,000 mcg by mouth daily.    . cetirizine (ZYRTEC) 10 MG tablet Take 10 mg by mouth daily as needed for allergies.    . Cholecalciferol (VITAMIN D-3) 5000 UNITS TABS Take 5,000 Units by mouth daily.     Marland Kitchen  Coenzyme Q10 (COQ-10) 100 MG CAPS Take 100 mg by mouth daily.    . COLLAGEN PO Take 1,000 mg by mouth daily.    . fluticasone (FLONASE) 50 MCG/ACT nasal spray Place 1 spray into both nostrils at bedtime.     Javier Docker Oil 500 MG CAPS Take 500 mg by mouth daily.    Marland Kitchen levothyroxine (SYNTHROID) 150 MCG tablet Take 150 mcg by mouth daily before breakfast.    . metoprolol succinate (TOPROL-XL) 25 MG 24 hr tablet Take 25 mg by mouth daily with lunch.    . mirtazapine (REMERON) 15 MG tablet Take 7.5 mg by mouth at bedtime.    . naproxen sodium (ALEVE) 220 MG tablet Take 220 mg by mouth daily as needed (pain).    Marland Kitchen  omeprazole (PRILOSEC) 40 MG capsule Take 40 mg by mouth daily before breakfast.     . Polyethyl Glycol-Propyl Glycol (SYSTANE ULTRA OP) Place 1 drop into both eyes in the morning and at bedtime.    . vitamin C (ASCORBIC ACID) 500 MG tablet Take 500 mg by mouth daily.      No results found for this or any previous visit (from the past 48 hour(s)). No results found.  Review of Systems  Blood pressure (!) 147/82, pulse 100, temperature 98.2 F (36.8 C), temperature source Oral, resp. rate (!) 22, height 5' 4.5" (1.638 m), SpO2 96 %. Physical Exam Constitutional:      Comments: Patient is thin.  HENT:     Mouth/Throat:     Mouth: Mucous membranes are moist.     Pharynx: Oropharynx is clear.  Eyes:     General: No scleral icterus.    Conjunctiva/sclera: Conjunctivae normal.  Cardiovascular:     Rate and Rhythm: Normal rate and regular rhythm.     Heart sounds: Normal heart sounds. No murmur heard.   Pulmonary:     Effort: Pulmonary effort is normal.     Breath sounds: Normal breath sounds.  Abdominal:     General: There is no distension.     Palpations: Abdomen is soft. There is no mass.     Tenderness: There is no abdominal tenderness.  Musculoskeletal:        General: No swelling.     Cervical back: Neck supple.  Lymphadenopathy:     Cervical: No cervical adenopathy.  Skin:    General: Skin is warm and dry.  Neurological:     Mental Status: She is alert.      Assessment/Plan  History of cecal sessile serrated polyp. Surveillance colonoscopy.  Hildred Laser, MD 02/01/2021, 8:21 AM

## 2021-02-01 NOTE — Discharge Instructions (Signed)
Resume usual medications as before.  Keep Aleve/naproxen use to minimum. High-fiber diet. No driving for 24 hours. Colonoscopy to be rescheduled in 3 to 4 months with 2-day prep. Office will notify you of a future date.    Colonoscopy, Adult, Care After This sheet gives you information about how to care for yourself after your procedure. Your doctor may also give you more specific instructions. If you have problems or questions, call your doctor. What can I expect after the procedure? After the procedure, it is common to have:  A small amount of blood in your poop (stool) for 24 hours.  Some gas.  Mild cramping or bloating in your belly (abdomen). Follow these instructions at home: Eating and drinking  Drink enough fluid to keep your pee (urine) pale yellow.  Follow instructions from your doctor about what you cannot eat or drink.  Return to your normal diet as told by your doctor. Avoid heavy or fried foods that are hard to digest.   Activity  Rest as told by your doctor.  Do not sit for a long time without moving. Get up to take short walks every 1-2 hours. This is important. Ask for help if you feel weak or unsteady.  Return to your normal activities as told by your doctor. Ask your doctor what activities are safe for you. To help cramping and bloating:  Try walking around.  Put heat on your belly as told by your doctor. Use the heat source that your doctor recommends, such as a moist heat pack or a heating pad. ? Put a towel between your skin and the heat source. ? Leave the heat on for 20-30 minutes. ? Remove the heat if your skin turns bright red. This is very important if you are unable to feel pain, heat, or cold. You may have a greater risk of getting burned.   General instructions  If you were given a medicine to help you relax (sedative) during your procedure, it can affect you for many hours. Do not drive or use machinery until your doctor says that it is  safe.  For the first 24 hours after the procedure: ? Do not sign important documents. ? Do not drink alcohol. ? Do your daily activities more slowly than normal. ? Eat foods that are soft and easy to digest.  Take over-the-counter or prescription medicines only as told by your doctor.  Keep all follow-up visits as told by your doctor. This is important. Contact a doctor if:  You have blood in your poop 2-3 days after the procedure. Get help right away if:  You have more than a small amount of blood in your poop.  You see large clumps of tissue (blood clots) in your poop.  Your belly is swollen.  You feel like you may vomit (nauseous).  You vomit.  You have a fever.  You have belly pain that gets worse, and medicine does not help your pain. Summary  After the procedure, it is common to have a small amount of blood in your poop. You may also have mild cramping and bloating in your belly.  If you were given a medicine to help you relax (sedative) during your procedure, it can affect you for many hours. Do not drive or use machinery until your doctor says that it is safe.  Get help right away if you have a lot of blood in your poop, feel like you may vomit, have a fever, or have more belly  pain. This information is not intended to replace advice given to you by your health care provider. Make sure you discuss any questions you have with your health care provider. Document Revised: 07/30/2019 Document Reviewed: 04/19/2019 Elsevier Patient Education  2021 [place].  Diverticulosis  Diverticulosis is a condition that develops when small pouches (diverticula) form in the wall of the large intestine (colon). The colon is where water is absorbed and stool (feces) is formed. The pouches form when the inside layer of the colon pushes through weak spots in the outer layers of the colon. You may have a few pouches or many of them. The pouches usually do not cause problems unless  they become inflamed or infected. When this happens, the condition is called diverticulitis. What are the causes? The cause of this condition is not known. What increases the risk? The following factors may make you more likely to develop this condition:  Being older than age 27. Your risk for this condition increases with age. Diverticulosis is rare among people younger than age 47. By age 57, many people have it.  Eating a low-fiber diet.  Having frequent constipation.  Being overweight.  Not getting enough exercise.  Smoking.  Taking over-the-counter pain medicines, like aspirin and ibuprofen.  Having a family history of diverticulosis. What are the signs or symptoms? In most people, there are no symptoms of this condition. If you do have symptoms, they may include:  Bloating.  Cramps in the abdomen.  Constipation or diarrhea.  Pain in the lower left side of the abdomen. How is this diagnosed? Because diverticulosis usually has no symptoms, it is most often diagnosed during an exam for other colon problems. The condition may be diagnosed by:  Using a flexible scope to examine the colon (colonoscopy).  Taking an X-ray of the colon after dye has been put into the colon (barium enema).  Having a CT scan. How is this treated? You may not need treatment for this condition. Your health care provider may recommend treatment to prevent problems. You may need treatment if you have symptoms or if you previously had diverticulitis. Treatment may include:  Eating a high-fiber diet.  Taking a fiber supplement.  Taking a live bacteria supplement (probiotic).  Taking medicine to relax your colon.   Follow these instructions at home: Medicines  Take over-the-counter and prescription medicines only as told by your health care provider.  If told by your health care provider, take a fiber supplement or probiotic. Constipation prevention Your condition may cause constipation.  To prevent or treat constipation, you may need to:  Drink enough fluid to keep your urine pale yellow.  Take over-the-counter or prescription medicines.  Eat foods that are high in fiber, such as beans, whole grains, and fresh fruits and vegetables.  Limit foods that are high in fat and processed sugars, such as fried or sweet foods.   General instructions  Try not to strain when you have a bowel movement.  Keep all follow-up visits as told by your health care provider. This is important. Contact a health care provider if you:  Have pain in your abdomen.  Have bloating.  Have cramps.  Have not had a bowel movement in 3 days. Get help right away if:  Your pain gets worse.  Your bloating becomes very bad.  You have a fever or chills, and your symptoms suddenly get worse.  You vomit.  You have bowel movements that are bloody or black.  You have bleeding  from your rectum. Summary  Diverticulosis is a condition that develops when small pouches (diverticula) form in the wall of the large intestine (colon).  You may have a few pouches or many of them.  This condition is most often diagnosed during an exam for other colon problems.  Treatment may include increasing the fiber in your diet, taking supplements, or taking medicines. This information is not intended to replace advice given to you by your health care provider. Make sure you discuss any questions you have with your health care provider. Document Revised: 04/22/2019 Document Reviewed: 04/22/2019 Elsevier Patient Education  [place].  High-Fiber Eating Plan Fiber, also called dietary fiber, is a type of carbohydrate. It is found foods such as fruits, vegetables, whole grains, and beans. A high-fiber diet can have many health benefits. Your health care provider may recommend a high-fiber diet to help:  Prevent constipation. Fiber can make your bowel movements more regular.  Lower your  cholesterol.  Relieve the following conditions: ? Inflammation of veins in the anus (hemorrhoids). ? Inflammation of specific areas of the digestive tract (uncomplicated diverticulosis). ? A problem of the large intestine, also called the colon, that sometimes causes pain and diarrhea (irritable bowel syndrome, or IBS).  Prevent overeating as part of a weight-loss plan.  Prevent heart disease, type 2 diabetes, and certain cancers. What are tips for following this plan? Reading food labels  Check the nutrition facts label on food products for the amount of dietary fiber. Choose foods that have 5 grams of fiber or more per serving.  The goals for recommended daily fiber intake include: ? Men (age 39 or younger): 34-38 g. ? Men (over age 63): 28-34 g. ? Women (age 49 or younger): 25-28 g. ? Women (over age 91): 22-25 g. Your daily fiber goal is _____________ g.   Shopping  Choose whole fruits and vegetables instead of processed forms, such as apple juice or applesauce.  Choose a wide variety of high-fiber foods such as avocados, lentils, oats, and kidney beans.  Read the nutrition facts label of the foods you choose. Be aware of foods with added fiber. These foods often have high sugar and sodium amounts per serving. Cooking  Use whole-grain flour for baking and cooking.  Cook with brown rice instead of white rice. Meal planning  Start the day with a breakfast that is high in fiber, such as a cereal that contains 5 g of fiber or more per serving.  Eat breads and cereals that are made with whole-grain flour instead of refined flour or white flour.  Eat brown rice, bulgur wheat, or millet instead of white rice.  Use beans in place of meat in soups, salads, and pasta dishes.  Be sure that half of the grains you eat each day are whole grains. General information  You can get the recommended daily intake of dietary fiber by: ? Eating a variety of fruits, vegetables, grains,  nuts, and beans. ? Taking a fiber supplement if you are not able to take in enough fiber in your diet. It is better to get fiber through food than from a supplement.  Gradually increase how much fiber you consume. If you increase your intake of dietary fiber too quickly, you may have bloating, cramping, or gas.  Drink plenty of water to help you digest fiber.  Choose high-fiber snacks, such as berries, raw vegetables, nuts, and popcorn. What foods should I eat? Fruits Berries. Pears. Apples. Oranges. Avocado. Prunes and raisins.  Dried figs. Vegetables Sweet potatoes. Spinach. Kale. Artichokes. Cabbage. Broccoli. Cauliflower. Green peas. Carrots. Squash. Grains Whole-grain breads. Multigrain cereal. Oats and oatmeal. Brown rice. Barley. Bulgur wheat. [place]. Quinoa. Bran muffins. Popcorn. Rye wafer crackers. Meats and other proteins Navy beans, kidney beans, and pinto beans. Soybeans. Split peas. Lentils. Nuts and seeds. Dairy Fiber-fortified yogurt. Beverages Fiber-fortified soy milk. Fiber-fortified orange juice. Other foods Fiber bars. The items listed above may not be a complete list of recommended foods and beverages. Contact a dietitian for more information. What foods should I avoid? Fruits Fruit juice. Cooked, strained fruit. Vegetables Fried potatoes. Canned vegetables. Well-cooked vegetables. Grains White bread. Pasta made with refined flour. White rice. Meats and other proteins Fatty cuts of meat. Fried chicken or fried fish. Dairy Milk. Yogurt. Cream cheese. Sour cream. Fats and oils Butters. Beverages Soft drinks. Other foods Cakes and pastries. The items listed above may not be a complete list of foods and beverages to avoid. Talk with your dietitian about what choices are best for you. Summary  Fiber is a type of carbohydrate. It is found in foods such as fruits, vegetables, whole grains, and beans.  A high-fiber diet has many benefits. It can help to  prevent constipation, lower blood cholesterol, aid weight loss, and reduce your risk of heart disease, diabetes, and certain cancers.  Increase your intake of fiber gradually. Increasing fiber too quickly may cause cramping, bloating, and gas. Drink plenty of water while you increase the amount of fiber you consume.  The best sources of fiber include whole fruits and vegetables, whole grains, nuts, seeds, and beans. This information is not intended to replace advice given to you by your health care provider. Make sure you discuss any questions you have with your health care provider. Document Revised: 01/27/2020 Document Reviewed: 01/27/2020 Elsevier Patient Education  2021 [employer].

## 2021-02-01 NOTE — Op Note (Signed)
[facility] Patient Name: Evelyn Walker Procedure Date: 02/01/2021 8:10 AM MRN: [id number] Date of Birth: January 18, 1935 Attending MD: Lionel December , MD CSN: [id number] Age: 86 Admit Type: Outpatient Procedure:                Colonoscopy Indications:              High risk colon cancer surveillance: Personal                            history of colon cancer Providers:                Lionel December, MD, Brain Hilts, RN, Edythe Clarity,                            Technician Referring MD:             Carylon Perches, MD Medicines:                Meperidine 50 mg IV, Midazolam 5 mg IV Complications:            No immediate complications. . Estimated Blood Loss:     Estimated blood loss: none. Procedure:                Pre-Anesthesia Assessment:                           - Prior to the procedure, a History and Physical                            was performed, and patient medications and                            allergies were reviewed. The patient's tolerance of                            previous anesthesia was also reviewed. The risks                            and benefits of the procedure and the sedation                            options and risks were discussed with the patient.                            All questions were answered, and informed consent                            was obtained. Prior Anticoagulants: The patient has                            taken no previous anticoagulant or antiplatelet                            agents except for aspirin. ASA Grade Assessment: II                            -  A patient with mild systemic disease. After                            reviewing the risks and benefits, the patient was                            deemed in satisfactory condition to undergo the                            procedure.                           After obtaining informed consent, the colonoscope                            was passed under direct vision. Throughout the                             procedure, the patient's blood pressure, pulse, and                            oxygen saturations were monitored continuously. The                            colonoscopy was aborted due to poor bowel prep. The                            patient tolerated the procedure well. The quality                            of the bowel preparation was poor. The rectum was                            photographed. The PCF-[id number] ([id number]) was                            introduced through the with the intention of                            advancing to the cecum. The scope was advanced to                            the sigmoid colon before the procedure was aborted.                            Medications were given. Scope In: 8:36:44 AM Scope Out: 8:53:09 AM Total Procedure Duration: 0 hours 16 minutes 25 seconds  Findings:      Normal perianal and rectal exam      Multiple small and large-mouthed diverticula were found in the sigmoid       colon. Impression:               - The procedure was aborted due to poor bowel prep.                           -  Preparation of the colon was poor.                           - Diverticulosis in the sigmoid colon.                           - No specimens collected. Moderate Sedation:      Moderate (conscious) sedation was administered by the endoscopy nurse       and supervised by the endoscopist. The following parameters were       monitored: oxygen saturation, heart rate, blood pressure, CO2       capnography and response to care. Total physician intraservice time was       23 minutes. Recommendation:           - Patient has a contact number available for                            emergencies. The signs and symptoms of potential                            delayed complications were discussed with the                            patient. Return to normal activities tomorrow.                            Written discharge instructions  were provided to the                            patient.                           - Resume previous diet.                           - Discharge patient to home (with spouse).                           - Continue present medications.                           - High fiber diet today.                           - Repeat colonoscopy in 3 months for surveillance. Procedure Code(s):        --- Professional ---                           [phone], Moderate sedation services provided by the                            same physician or other qualified health care                            professional performing the diagnostic or  therapeutic service that the sedation supports,                            requiring the presence of an independent trained                            observer to assist in the monitoring of the                            patient's level of consciousness and physiological                            status; initial 15 minutes of intraservice time,                            patient age 62 years or older                           [phone], Moderate sedation; each additional 15                            minutes intraservice time Diagnosis Code(s):        --- Professional ---                           [phone], Personal history of other malignant                            neoplasm of large intestine                           K57.30, Diverticulosis of large intestine without                            perforation or abscess without bleeding CPT copyright 2019 American Medical Association. All rights reserved. The codes documented in this report are preliminary and upon coder review may  be revised to meet current compliance requirements. Hildred Laser, MD Hildred Laser, MD 02/01/2021 9:14:18 AM This report has been signed electronically. Number of Addenda: 0

## 2021-02-06 ENCOUNTER — Encounter (HOSPITAL_COMMUNITY): Payer: Self-pay | Admitting: Internal Medicine

## 2021-02-06 DIAGNOSIS — R002 Palpitations: Secondary | ICD-10-CM | POA: Diagnosis not present

## 2021-02-06 DIAGNOSIS — F329 Major depressive disorder, single episode, unspecified: Secondary | ICD-10-CM | POA: Diagnosis not present

## 2021-02-09 DIAGNOSIS — Z23 Encounter for immunization: Secondary | ICD-10-CM | POA: Diagnosis not present

## 2021-05-11 DIAGNOSIS — R002 Palpitations: Secondary | ICD-10-CM | POA: Diagnosis not present

## 2021-05-11 DIAGNOSIS — F320 Major depressive disorder, single episode, mild: Secondary | ICD-10-CM | POA: Diagnosis not present

## 2021-05-25 ENCOUNTER — Other Ambulatory Visit (INDEPENDENT_AMBULATORY_CARE_PROVIDER_SITE_OTHER): Payer: Self-pay

## 2021-05-25 DIAGNOSIS — Z86010 Personal history of colonic polyps: Secondary | ICD-10-CM

## 2021-05-25 DIAGNOSIS — Z1211 Encounter for screening for malignant neoplasm of colon: Secondary | ICD-10-CM

## 2021-06-25 ENCOUNTER — Encounter (INDEPENDENT_AMBULATORY_CARE_PROVIDER_SITE_OTHER): Payer: Self-pay

## 2021-06-25 ENCOUNTER — Telehealth (INDEPENDENT_AMBULATORY_CARE_PROVIDER_SITE_OTHER): Payer: Self-pay

## 2021-06-25 MED ORDER — CLENPIQ 10-3.5-12 MG-GM -GM/160ML PO SOLN: 1.0000 | Freq: Once | ORAL | 0 refills | Status: AC

## 2021-06-25 NOTE — Telephone Encounter (Signed)
Evelyn Walker, CMA  

## 2021-06-27 NOTE — Patient Instructions (Signed)
Evelyn Walker  06/27/2021     @PREFPERIOPPHARMACY @   Your procedure is scheduled on 07/04/2021.   Report to [facility] at  0700 A.M.   Call this number if you have problems the morning of surgery:  [phone]   Remember:  Follow the diet and prep instructions given to you by the office.    Take these medicines the morning of surgery with A SIP OF WATER                  levothyroxine, metoprolol, prilosec.    Do not wear jewelry, make-up or nail polish.  Do not wear lotions, powders, or perfumes, or deodorant.  Do not shave 48 hours prior to surgery.  Men may shave face and neck.  Do not bring valuables to the hospital.  [facility] is not responsible for any belongings or valuables.  Contacts, dentures or bridgework may not be worn into surgery.  Leave your suitcase in the car.  After surgery it may be brought to your room.  For patients admitted to the hospital, discharge time will be determined by your treatment team.  Patients discharged the day of surgery will not be allowed to drive home and must have someone with them for 24 hours.    Special instructions:   DO NOT smoke tobacco or vape for 24 hours before your procedure.  Please read over the following fact sheets that you were given. Anesthesia Post-op Instructions and Care and Recovery After Surgery      Colonoscopy, Adult, Care After This sheet gives you information about how to care for yourself after your procedure. Your health care provider may also give you more specific instructions. If you have problems or questions, contact your health care provider. What can I expect after the procedure? After the procedure, it is common to have: A small amount of blood in your stool for 24 hours after the procedure. Some gas. Mild cramping or bloating of your abdomen. Follow these instructions at home: Eating and drinking  Drink enough fluid to keep your urine pale yellow. Follow instructions from your  health care provider about eating or drinking restrictions. Resume your normal diet as instructed by your health care provider. Avoid heavy or fried foods that are hard to digest. Activity Rest as told by your health care provider. Avoid sitting for a long time without moving. Get up to take short walks every 1-2 hours. This is important to improve blood flow and breathing. Ask for help if you feel weak or unsteady. Return to your normal activities as told by your health care provider. Ask your health care provider what activities are safe for you. Managing cramping and bloating  Try walking around when you have cramps or feel bloated. Apply heat to your abdomen as told by your health care provider. Use the heat source that your health care provider recommends, such as a moist heat pack or a heating pad. Place a towel between your skin and the heat source. Leave the heat on for 20-30 minutes. Remove the heat if your skin turns bright red. This is especially important if you are unable to feel pain, heat, or cold. You may have a greater risk of getting burned. General instructions If you were given a sedative during the procedure, it can affect you for several hours. Do not drive or operate machinery until your health care provider says that it is safe. For the first 24 hours after the  procedure: Do not sign important documents. Do not drink alcohol. Do your regular daily activities at a slower pace than normal. Eat soft foods that are easy to digest. Take over-the-counter and prescription medicines only as told by your health care provider. Keep all follow-up visits as told by your health care provider. This is important. Contact a health care provider if: You have blood in your stool 2-3 days after the procedure. Get help right away if you have: More than a small spotting of blood in your stool. Large blood clots in your stool. Swelling of your abdomen. Nausea or vomiting. A  fever. Increasing pain in your abdomen that is not relieved with medicine. Summary After the procedure, it is common to have a small amount of blood in your stool. You may also have mild cramping and bloating of your abdomen. If you were given a sedative during the procedure, it can affect you for several hours. Do not drive or operate machinery until your health care provider says that it is safe. Get help right away if you have a lot of blood in your stool, nausea or vomiting, a fever, or increased pain in your abdomen. This information is not intended to replace advice given to you by your health care provider. Make sure you discuss any questions you have with your health care provider. Document Revised: 09/17/2019 Document Reviewed: 04/19/2019 Elsevier Patient Education  [place] After This sheet gives you information about how to care for yourself after your procedure. Your health care provider may also give you more specific instructions. If you have problems or questions, contact your health care provider. What can I expect after the procedure? After the procedure, it is common to have: Tiredness. Forgetfulness about what happened after the procedure. Impaired judgment for important decisions. Nausea or vomiting. Some difficulty with balance. Follow these instructions at home: For the time period you were told by your health care provider:   Rest as needed. Do not participate in activities where you could fall or become injured. Do not drive or use machinery. Do not drink alcohol. Do not take sleeping pills or medicines that cause drowsiness. Do not make important decisions or sign legal documents. Do not take care of children on your own. Eating and drinking Follow the diet that is recommended by your health care provider. Drink enough fluid to keep your urine pale yellow. If you vomit: Drink water, juice, or soup when  you can drink without vomiting. Make sure you have little or no nausea before eating solid foods. General instructions Have a responsible adult stay with you for the time you are told. It is important to have someone help care for you until you are awake and alert. Take over-the-counter and prescription medicines only as told by your health care provider. If you have sleep apnea, surgery and certain medicines can increase your risk for breathing problems. Follow instructions from your health care provider about wearing your sleep device: Anytime you are sleeping, including during daytime naps. While taking prescription pain medicines, sleeping medicines, or medicines that make you drowsy. Avoid smoking. Keep all follow-up visits as told by your health care provider. This is important. Contact a health care provider if: You keep feeling nauseous or you keep vomiting. You feel light-headed. You are still sleepy or having trouble with balance after 24 hours. You develop a rash. You have a fever. You have redness or swelling  around the IV site. Get help right away if: You have trouble breathing. You have new-onset confusion at home. Summary For several hours after your procedure, you may feel tired. You may also be forgetful and have poor judgment. Have a responsible adult stay with you for the time you are told. It is important to have someone help care for you until you are awake and alert. Rest as told. Do not drive or operate machinery. Do not drink alcohol or take sleeping pills. Get help right away if you have trouble breathing, or if you suddenly become confused. This information is not intended to replace advice given to you by your health care provider. Make sure you discuss any questions you have with your health care provider. Document Revised: 06/08/2020 Document Reviewed: 08/26/2019 Elsevier Patient Education  2022 [employer].

## 2021-07-02 ENCOUNTER — Encounter (HOSPITAL_COMMUNITY)
Admission: RE | Admit: 2021-07-02 | Discharge: 2021-07-02 | Disposition: A | Discharge status: Home / Self Care | Payer: Medicare Other | Source: Ambulatory Visit | Attending: Internal Medicine | Admitting: Internal Medicine

## 2021-07-02 ENCOUNTER — Other Ambulatory Visit: Payer: Self-pay

## 2021-07-02 ENCOUNTER — Encounter (HOSPITAL_COMMUNITY): Payer: Self-pay

## 2021-07-02 DIAGNOSIS — Z1211 Encounter for screening for malignant neoplasm of colon: Secondary | ICD-10-CM

## 2021-07-02 DIAGNOSIS — Z01810 Encounter for preprocedural cardiovascular examination: Secondary | ICD-10-CM | POA: Insufficient documentation

## 2021-07-02 DIAGNOSIS — Z86010 Personal history of colonic polyps: Secondary | ICD-10-CM

## 2021-07-02 HISTORY — DX: Cardiac arrhythmia, unspecified: I49.9

## 2021-07-04 ENCOUNTER — Encounter (HOSPITAL_COMMUNITY): Payer: Self-pay | Admitting: Internal Medicine

## 2021-07-04 ENCOUNTER — Ambulatory Visit (HOSPITAL_COMMUNITY)
Admission: RE | Admit: 2021-07-04 | Discharge: 2021-07-04 | Disposition: A | Discharge status: Home / Self Care | Payer: Medicare Other | Attending: Internal Medicine | Admitting: Internal Medicine

## 2021-07-04 ENCOUNTER — Ambulatory Visit (HOSPITAL_COMMUNITY): Payer: Medicare Other | Admitting: Anesthesiology

## 2021-07-04 ENCOUNTER — Encounter (INDEPENDENT_AMBULATORY_CARE_PROVIDER_SITE_OTHER): Payer: Self-pay | Admitting: *Deleted

## 2021-07-04 ENCOUNTER — Encounter (HOSPITAL_COMMUNITY)
Admission: RE | Disposition: A | Discharge status: Home / Self Care | Payer: Self-pay | Source: Home / Self Care | Attending: Internal Medicine

## 2021-07-04 DIAGNOSIS — K56699 Other intestinal obstruction unspecified as to partial versus complete obstruction: Secondary | ICD-10-CM | POA: Insufficient documentation

## 2021-07-04 DIAGNOSIS — K6389 Other specified diseases of intestine: Secondary | ICD-10-CM | POA: Diagnosis not present

## 2021-07-04 DIAGNOSIS — K648 Other hemorrhoids: Secondary | ICD-10-CM | POA: Insufficient documentation

## 2021-07-04 DIAGNOSIS — Z79899 Other long term (current) drug therapy: Secondary | ICD-10-CM | POA: Diagnosis not present

## 2021-07-04 DIAGNOSIS — Z87891 Personal history of nicotine dependence: Secondary | ICD-10-CM | POA: Diagnosis not present

## 2021-07-04 DIAGNOSIS — K644 Residual hemorrhoidal skin tags: Secondary | ICD-10-CM | POA: Insufficient documentation

## 2021-07-04 DIAGNOSIS — Z09 Encounter for follow-up examination after completed treatment for conditions other than malignant neoplasm: Secondary | ICD-10-CM | POA: Insufficient documentation

## 2021-07-04 DIAGNOSIS — Z1211 Encounter for screening for malignant neoplasm of colon: Secondary | ICD-10-CM

## 2021-07-04 DIAGNOSIS — K5730 Diverticulosis of large intestine without perforation or abscess without bleeding: Secondary | ICD-10-CM | POA: Diagnosis not present

## 2021-07-04 DIAGNOSIS — K5732 Diverticulitis of large intestine without perforation or abscess without bleeding: Secondary | ICD-10-CM | POA: Diagnosis not present

## 2021-07-04 DIAGNOSIS — E039 Hypothyroidism, unspecified: Secondary | ICD-10-CM | POA: Diagnosis not present

## 2021-07-04 DIAGNOSIS — Z86010 Personal history of colonic polyps: Secondary | ICD-10-CM | POA: Diagnosis not present

## 2021-07-04 HISTORY — PX: COLONOSCOPY WITH PROPOFOL: SHX5780

## 2021-07-04 LAB — HM COLONOSCOPY

## 2021-07-04 SURGERY — COLONOSCOPY WITH PROPOFOL
Anesthesia: General

## 2021-07-04 MED ORDER — PROPOFOL 500 MG/50ML IV EMUL
INTRAVENOUS | Status: DC | PRN
  Administered 2021-07-04: 150 ug/kg/min via INTRAVENOUS

## 2021-07-04 MED ORDER — METRONIDAZOLE 500 MG PO TABS
500.0000 mg | ORAL_TABLET | Freq: Three times a day (TID) | ORAL | 0 refills | Status: AC
Start: 2021-07-04 — End: ?

## 2021-07-04 MED ORDER — DOCUSATE SODIUM 100 MG PO CAPS: 200.0000 mg | ORAL_CAPSULE | Freq: Every day | ORAL | 0 refills | Status: AC

## 2021-07-04 MED ORDER — PHENYLEPHRINE HCL (PRESSORS) 10 MG/ML IV SOLN
INTRAVENOUS | Status: DC | PRN
  Administered 2021-07-04 (×3): 80 ug via INTRAVENOUS

## 2021-07-04 MED ORDER — PROPOFOL 10 MG/ML IV BOLUS
INTRAVENOUS | Status: AC
  Filled 2021-07-04: qty 60

## 2021-07-04 MED ORDER — LACTATED RINGERS IV SOLN: INTRAVENOUS | Status: DC

## 2021-07-04 MED ORDER — CIPROFLOXACIN HCL 500 MG PO TABS: 500.0000 mg | ORAL_TABLET | Freq: Two times a day (BID) | ORAL | 0 refills | Status: AC

## 2021-07-04 MED ORDER — PHENYLEPHRINE 40 MCG/ML (10ML) SYRINGE FOR IV PUSH (FOR BLOOD PRESSURE SUPPORT)
PREFILLED_SYRINGE | INTRAVENOUS | Status: AC
  Filled 2021-07-04: qty 10

## 2021-07-04 MED ORDER — STERILE WATER FOR IRRIGATION IR SOLN
Status: DC | PRN
  Administered 2021-07-04: 200 mL

## 2021-07-04 MED ORDER — PROPOFOL 10 MG/ML IV BOLUS
INTRAVENOUS | Status: DC | PRN
Start: 2021-07-04 — End: 2021-07-04
  Administered 2021-07-04: 40 mg via INTRAVENOUS
  Administered 2021-07-04 (×4): 50 mg via INTRAVENOUS
  Administered 2021-07-04 (×2): 30 mg via INTRAVENOUS

## 2021-07-04 NOTE — Discharge Instructions (Addendum)
Cipro 500 mg by mouth twice daily for 10 days  metronidazole 500 mg by mouth twice daily for 10 days. take it after breakfast and evening meal. Colace 200 mg by mouth daily at bedtime( stool softener). Resume other medications as before Begin Metamucil 3 to 4 g by mouth daily at bedtime after 1 week and take it every day Resume usual diet. No driving for 24 hours.

## 2021-07-04 NOTE — Anesthesia Postprocedure Evaluation (Signed)
Anesthesia Post Note  Patient: Evelyn Walker  Procedure(s) Performed: COLONOSCOPY WITH PROPOFOL  Patient location during evaluation: Endoscopy Anesthesia Type: General Level of consciousness: awake and alert Pain management: pain level controlled Vital Signs Assessment: post-procedure vital signs reviewed and stable Respiratory status: spontaneous breathing Cardiovascular status: blood pressure returned to baseline and stable Postop Assessment: no apparent nausea or vomiting Anesthetic complications: no   No notable events documented.   Last Vitals:  Vitals:   07/04/21 0717  BP: (!) 161/91  Pulse: 88  Resp: 18  Temp: 37.1 C  SpO2: 95%    Last Pain:  Vitals:   07/04/21 0717  TempSrc: Oral  PainSc: 0-No pain                 Akshith Moncus

## 2021-07-04 NOTE — H&P (Signed)
Evelyn Walker is an 86 y.o. female.   Chief Complaint: Evelyn Walker is here for colonoscopy. HPI:   Evelyn Walker is 86 year old Caucasian female who has history of colonic polyps.  She had small sessile serrated polyp at appendiceal orifice which was  ablated by cold biopsy and the site was treated with APC.  She returned for surveillance colonoscopy in April of this year but her prep was poor.  Therefore examination was aborted.   She complains of intermittent constipation.  She states activity has helped.  She denies abdominal pain or rectal bleeding.  She has not taken Naprosyn recently.  She does not take aspirin.   Family history is negative for CRC.  Past Medical History:  Diagnosis Date   Arthritis    Dysrhythmia    Essential hypertension    GERD (gastroesophageal reflux disease)    Hypothyroidism    PONV (postoperative nausea and vomiting)    ? whether morphine or anesthesia   Primary localized osteoarthritis of left knee    Rhinitis     Past Surgical History:  Procedure Laterality Date   CATARACT EXTRACTION Bilateral    COLONOSCOPY N/A 07/27/2014   Procedure: COLONOSCOPY;  Surgeon: Rogene Houston, MD;  [facility];  Service: Endoscopy;  Laterality: N/A;  100   COLONOSCOPY N/A 10/15/2017   Procedure: COLONOSCOPY;  Surgeon: Rogene Houston, MD;  [facility];  Service: Endoscopy;  Laterality: N/A;  1030   COLONOSCOPY N/A 02/01/2021   Procedure: COLONOSCOPY;  Surgeon: Rogene Houston, MD;  [facility];  Service: Endoscopy;  Laterality: N/A;  am   HOT HEMOSTASIS  10/15/2017   Procedure: HOT HEMOSTASIS (ARGON PLASMA COAGULATION/BICAP);  Surgeon: Rogene Houston, MD;  [facility];  Service: Endoscopy;;  CECUM   POLYPECTOMY  10/15/2017   Procedure: POLYPECTOMY;  Surgeon: Rogene Houston, MD;  [facility];  Service: Endoscopy;;  colon   TOTAL KNEE ARTHROPLASTY Right 2004   TOTAL KNEE ARTHROPLASTY Left 07/31/2015   Procedure: TOTAL KNEE  ARTHROPLASTY;  Surgeon: Elsie Saas, MD;  Location: [facility];  Service: Orthopedics;  Laterality: Left;    Family History  Problem Relation Age of Onset   Stroke Mother    Heart attack Father    Breast cancer Paternal Grandfather    Diabetes Paternal Grandfather    Colon cancer Neg Hx    Social History:  reports that she quit smoking about 34 years ago. Her smoking use included cigarettes. She started smoking about 63 years ago. She has a 2.50 pack-year smoking history. She has never used smokeless tobacco. She reports current alcohol use of about 3.0 standard drinks per week. She reports that she does not use drugs.  Allergies:  Allergies  Allergen Reactions   Other     Seasonal allergies    Medications Prior to Admission  Medication Sig Dispense Refill   acetaminophen (TYLENOL) 500 MG tablet Take 1,000 mg by mouth 2 (two) times daily as needed for moderate pain.     amoxicillin (AMOXIL) 500 MG capsule Take 500 mg by mouth See admin instructions. Take 4 capsules (2000 mg) by mouth one hour prior to dental appointments     Biotin 5000 MCG CAPS Take 5,000 mcg by mouth daily.     cetirizine (ZYRTEC) 10 MG tablet Take 10 mg by mouth daily.     Cholecalciferol (VITAMIN D-3) 5000 UNITS TABS Take 5,000 Units by mouth daily.      Coenzyme Q10 (  COQ-10) 100 MG CAPS Take 100 mg by mouth daily.     fluticasone (FLONASE) 50 MCG/ACT nasal spray Place 1 spray into both nostrils at bedtime.      Krill Oil 500 MG CAPS Take 500 mg by mouth daily.     levothyroxine (SYNTHROID) 150 MCG tablet Take 150 mcg by mouth daily before breakfast.     metoprolol succinate (TOPROL-XL) 25 MG 24 hr tablet Take 25 mg by mouth daily with lunch.     mirtazapine (REMERON) 7.5 MG tablet Take 7.5 mg by mouth at bedtime.     Multiple Vitamin (MULTIVITAMIN WITH MINERALS) TABS tablet Take 1 tablet by mouth daily.     naproxen sodium (ALEVE) 220 MG tablet Take 220 mg by mouth daily as needed (pain).     omeprazole  (PRILOSEC) 40 MG capsule Take 40 mg by mouth daily before breakfast.      Polyethyl Glycol-Propyl Glycol (SYSTANE ULTRA OP) Place 1 drop into both eyes 3 (three) times daily as needed (dry eyes).     vitamin C (ASCORBIC ACID) 500 MG tablet Take 500 mg by mouth daily.      No results found for this or any previous visit (from the past 48 hour(s)). No results found.  Review of Systems  Blood pressure (!) 161/91, pulse 88, temperature 98.7 F (37.1 C), temperature source Oral, resp. rate 18, SpO2 95 %. Physical Exam HENT:     Mouth/Throat:     Mouth: Mucous membranes are moist.     Pharynx: Oropharynx is clear.  Eyes:     Conjunctiva/sclera: Conjunctivae normal.  Cardiovascular:     Rate and Rhythm: Normal rate and regular rhythm.     Heart sounds: Normal heart sounds. No murmur heard. Pulmonary:     Effort: Pulmonary effort is normal.     Breath sounds: Normal breath sounds.  Abdominal:     General: There is no distension.     Palpations: Abdomen is soft. There is no mass.     Tenderness: There is no abdominal tenderness.  Musculoskeletal:        General: No swelling.     Cervical back: Neck supple.  Lymphadenopathy:     Cervical: No cervical adenopathy.  Skin:    General: Skin is warm and dry.  Neurological:     Mental Status: She is alert.     Assessment/Plan    History of colonic polyps.   Surveillance colonoscopy under monitored anesthesia care.  Hildred Laser, MD 07/04/2021, 8:08 AM

## 2021-07-04 NOTE — Anesthesia Preprocedure Evaluation (Signed)
Anesthesia Evaluation  Patient identified by MRN, date of birth, ID band Patient awake    Reviewed: Allergy & Precautions, NPO status , Patient's Chart, lab work & pertinent test results, reviewed documented beta blocker date and time   History of Anesthesia Complications (+) PONV and history of anesthetic complications  Airway Mallampati: II  TM Distance: >3 FB Neck ROM: Full    Dental  (+) Dental Advisory Given, Teeth Intact   Pulmonary former smoker,    Pulmonary exam normal breath sounds clear to auscultation       Cardiovascular Exercise Tolerance: Good hypertension, Pt. on medications and Pt. on home beta blockers Normal cardiovascular exam+ dysrhythmias  Rhythm:Regular Rate:Normal  02-Jul-2021 11:29:05 [facility] ROUTINE RECORD September 12, 1935 (73 yr) Female Caucasian Vent. rate 68 BPM PR interval 134 ms QRS duration 72 ms QT/QTcB 420/446 ms P-R-T axes 61 67 50 Normal sinus rhythm Normal ECG No significant change since last tracing Confirmed by Skeet Latch [phone]) on 07/03/2021 8:25:02 AM   Neuro/Psych negative neurological ROS  negative psych ROS   GI/Hepatic Neg liver ROS, GERD  Medicated and Controlled,  Endo/Other  negative endocrine ROSHypothyroidism   Renal/GU negative Renal ROS     Musculoskeletal  (+) Arthritis ,   Abdominal   Peds  Hematology negative hematology ROS (+)   Anesthesia Other Findings   Reproductive/Obstetrics                             Anesthesia Physical Anesthesia Plan  ASA: 2  Anesthesia Plan: General   Post-op Pain Management:    Induction: Intravenous  PONV Risk Score and Plan: TIVA  Airway Management Planned: Nasal Cannula and Natural Airway  Additional Equipment:   Intra-op Plan:   Post-operative Plan:   Informed Consent: I have reviewed the patients History and Physical, chart, labs and discussed the  procedure including the risks, benefits and alternatives for the proposed anesthesia with the patient or authorized representative who has indicated his/her understanding and acceptance.     Dental advisory given  Plan Discussed with: CRNA and Surgeon  Anesthesia Plan Comments:         Anesthesia Quick Evaluation

## 2021-07-04 NOTE — Transfer of Care (Signed)
Immediate Anesthesia Transfer of Care Note  Patient: Evelyn Walker  Procedure(s) Performed: COLONOSCOPY WITH PROPOFOL  Patient Location: Short Stay  Anesthesia Type:General  Level of Consciousness: awake  Airway & Oxygen Therapy: Patient Spontanous Breathing  Post-op Assessment: Report given to RN  Post vital signs: Reviewed and stable  Last Vitals:  Vitals Value Taken Time  BP    Temp    Pulse    Resp    SpO2      Last Pain:  Vitals:   07/04/21 0717  TempSrc: Oral  PainSc: 0-No pain      Patients Stated Pain Goal: 4 (09/30/82 4621)  Complications: No notable events documented.

## 2021-07-04 NOTE — Op Note (Signed)
[facility] Patient Name: Evelyn Walker Procedure Date: 07/04/2021 8:13 AM MRN: [id number] Date of Birth: January 05, 1935 Attending MD: Hildred Laser , MD CSN: [id number] Age: 86 Admit Type: Outpatient Procedure:                Colonoscopy Indications:              High risk colon cancer surveillance: Personal                            history of colonic polyps Providers:                Hildred Laser, MD, Lurline Del, RN, Randa Spike,                            Technician Referring MD:             Asencion Noble, MD Medicines:                Propofol per Anesthesia Complications:            No immediate complications. Estimated Blood Loss:     Estimated blood loss: none. Procedure:                Pre-Anesthesia Assessment:                           - Prior to the procedure, a History and Physical                            was performed, and patient medications and                            allergies were reviewed. The patient's tolerance of                            previous anesthesia was also reviewed. The risks                            and benefits of the procedure and the sedation                            options and risks were discussed with the patient.                            All questions were answered, and informed consent                            was obtained. Prior Anticoagulants: The patient has                            taken no previous anticoagulant or antiplatelet                            agents. ASA Grade Assessment: II - A patient with  mild systemic disease. After reviewing the risks                            and benefits, the patient was deemed in                            satisfactory condition to undergo the procedure.                           After obtaining informed consent, the colonoscope                            was passed under direct vision. Throughout the                            procedure, the patient's blood  pressure, pulse, and                            oxygen saturations were monitored continuously. The                            [id number]-[id number] ([id number]) scope was introduced through                            the anus and advanced to the the cecum, identified                            by appendiceal orifice and ileocecal valve. The                            colonoscopy was technically difficult and complex                            due to multiple diverticula in the colon.                            Successful completion of the procedure was aided by                            withdrawing the scope and replacing with the                            'babyscope'. The patient tolerated the procedure                            well. The quality of the bowel preparation was                            good. The ileocecal valve, appendiceal orifice, and                            rectum were photographed. Scope In: 8:30:15 AM Scope Out: 8:56:53 AM Scope Withdrawal Time: 0 hours 8 minutes 15 seconds  Total Procedure Duration:  0 hours 26 minutes 38 seconds  Findings:      The perianal and digital rectal examinations were normal.      The transverse colon, hepatic flexure, ascending colon, cecum,       appendiceal orifice and ileocecal valve appeared normal.      A few diverticula were found in the descending colon.      Multiple small and large-mouthed diverticula were found in the sigmoid       colon.      A benign-appearing, intrinsic mild stenosis was found in the mid sigmoid       colon.      A localized area of congested and erythematous mucosa was found in the       distal sigmoid colon with mucopurulent material next to a diverticulum.      External and internal hemorrhoids were found during retroflexion. The       hemorrhoids were medium-sized. Impression:               - The transverse colon, hepatic flexure, ascending                            colon, cecum, appendiceal orifice and  ileocecal                            valve are normal.                           - Diverticulosis in the descending colon.                           - Diverticulosis in the sigmoid colon.                           - Noncritical stricture in the mid sigmoid colon                            secondary to diverticular disease.                           - Sigmoid diverticulitis..                           - External and internal hemorrhoids.                           - No specimens collected.                           Comment: no residual polyp noted at appendiceal                            orifice. Moderate Sedation:      Per Anesthesia Care Recommendation:           - Patient has a contact number available for                            emergencies. The signs and symptoms of potential  delayed complications were discussed with the                            patient. Return to normal activities tomorrow.                            Written discharge instructions were provided to the                            patient.                           - Resume previous diet today.                           - Continue present medications.                           - Cipro (ciprofloxacin) 500 mg PO BID for 10 days.                           - Flagyl (metronidazole) 500 mg PO BID for 10 days.                           - Colace 200 mg by mouth daily at bedtime.                           - No repeat colonoscopy due to age and the absence                            of advanced adenomas. Procedure Code(s):        --- Professional ---                           [phone], Colonoscopy, flexible; diagnostic, including                            collection of specimen(s) by brushing or washing,                            when performed (separate procedure) Diagnosis Code(s):        --- Professional ---                           Z86.010, Personal history of colonic polyps                            K64.8, Other hemorrhoids                           K56.699, Other intestinal obstruction unspecified                            as to partial versus complete obstruction  K63.89, Other specified diseases of intestine                           K57.30, Diverticulosis of large intestine without                            perforation or abscess without bleeding CPT copyright 2019 [employer]. All rights reserved. The codes documented in this report are preliminary and upon coder review may  be revised to meet current compliance requirements. Hildred Laser, MD Hildred Laser, MD 07/04/2021 9:18:04 AM This report has been signed electronically. Number of Addenda: 0

## 2021-07-10 ENCOUNTER — Encounter (HOSPITAL_COMMUNITY): Payer: Self-pay | Admitting: Internal Medicine

## 2021-07-13 DIAGNOSIS — Z23 Encounter for immunization: Secondary | ICD-10-CM | POA: Diagnosis not present

## 2021-07-26 DIAGNOSIS — Z23 Encounter for immunization: Secondary | ICD-10-CM | POA: Diagnosis not present

## 2021-09-06 DIAGNOSIS — F329 Major depressive disorder, single episode, unspecified: Secondary | ICD-10-CM | POA: Diagnosis not present

## 2021-09-06 DIAGNOSIS — M1990 Unspecified osteoarthritis, unspecified site: Secondary | ICD-10-CM | POA: Diagnosis not present

## 2021-09-06 DIAGNOSIS — R7301 Impaired fasting glucose: Secondary | ICD-10-CM | POA: Diagnosis not present

## 2021-09-06 DIAGNOSIS — E039 Hypothyroidism, unspecified: Secondary | ICD-10-CM | POA: Diagnosis not present

## 2021-09-06 DIAGNOSIS — Z79899 Other long term (current) drug therapy: Secondary | ICD-10-CM | POA: Diagnosis not present

## 2021-09-06 DIAGNOSIS — R002 Palpitations: Secondary | ICD-10-CM | POA: Diagnosis not present

## 2021-09-13 DIAGNOSIS — I700 Atherosclerosis of aorta: Secondary | ICD-10-CM | POA: Diagnosis not present

## 2021-09-13 DIAGNOSIS — I493 Ventricular premature depolarization: Secondary | ICD-10-CM | POA: Diagnosis not present

## 2021-09-13 DIAGNOSIS — E039 Hypothyroidism, unspecified: Secondary | ICD-10-CM | POA: Diagnosis not present

## 2021-09-14 ENCOUNTER — Other Ambulatory Visit (HOSPITAL_COMMUNITY): Payer: Self-pay | Admitting: Internal Medicine

## 2021-09-14 DIAGNOSIS — Z1231 Encounter for screening mammogram for malignant neoplasm of breast: Secondary | ICD-10-CM

## 2021-09-14 DIAGNOSIS — M810 Age-related osteoporosis without current pathological fracture: Secondary | ICD-10-CM

## 2021-09-14 DIAGNOSIS — Z13820 Encounter for screening for osteoporosis: Secondary | ICD-10-CM

## 2021-10-17 ENCOUNTER — Ambulatory Visit (HOSPITAL_COMMUNITY): Payer: Medicare Other

## 2021-10-17 ENCOUNTER — Other Ambulatory Visit (HOSPITAL_COMMUNITY): Payer: Medicare Other

## 2021-12-05 DIAGNOSIS — E785 Hyperlipidemia, unspecified: Secondary | ICD-10-CM | POA: Diagnosis not present

## 2021-12-05 DIAGNOSIS — I700 Atherosclerosis of aorta: Secondary | ICD-10-CM | POA: Diagnosis not present

## 2021-12-05 DIAGNOSIS — Z79899 Other long term (current) drug therapy: Secondary | ICD-10-CM | POA: Diagnosis not present

## 2022-01-04 DIAGNOSIS — E785 Hyperlipidemia, unspecified: Secondary | ICD-10-CM | POA: Diagnosis not present

## 2022-01-04 DIAGNOSIS — I700 Atherosclerosis of aorta: Secondary | ICD-10-CM | POA: Diagnosis not present

## 2022-06-04 DIAGNOSIS — R55 Syncope and collapse: Secondary | ICD-10-CM | POA: Diagnosis not present

## 2022-07-06 DIAGNOSIS — K2100 Gastro-esophageal reflux disease with esophagitis, without bleeding: Secondary | ICD-10-CM | POA: Diagnosis not present

## 2022-07-06 DIAGNOSIS — I10 Essential (primary) hypertension: Secondary | ICD-10-CM | POA: Diagnosis not present

## 2022-07-06 DIAGNOSIS — E039 Hypothyroidism, unspecified: Secondary | ICD-10-CM | POA: Diagnosis not present

## 2022-07-11 ENCOUNTER — Other Ambulatory Visit (HOSPITAL_COMMUNITY): Payer: Self-pay | Admitting: Internal Medicine

## 2022-07-11 DIAGNOSIS — R55 Syncope and collapse: Secondary | ICD-10-CM

## 2022-08-06 ENCOUNTER — Ambulatory Visit (HOSPITAL_COMMUNITY)
Admission: RE | Admit: 2022-08-06 | Discharge: 2022-08-06 | Disposition: A | Discharge status: Home / Self Care | Payer: Medicare Other | Source: Ambulatory Visit | Attending: Internal Medicine | Admitting: Internal Medicine

## 2022-08-06 DIAGNOSIS — R55 Syncope and collapse: Secondary | ICD-10-CM | POA: Diagnosis not present

## 2022-08-06 DIAGNOSIS — R42 Dizziness and giddiness: Secondary | ICD-10-CM | POA: Diagnosis not present

## 2022-08-09 ENCOUNTER — Other Ambulatory Visit (HOSPITAL_COMMUNITY): Payer: Self-pay | Admitting: Internal Medicine

## 2022-08-09 DIAGNOSIS — I720 Aneurysm of carotid artery: Secondary | ICD-10-CM

## 2022-08-13 DIAGNOSIS — Z23 Encounter for immunization: Secondary | ICD-10-CM | POA: Diagnosis not present

## 2022-09-02 ENCOUNTER — Ambulatory Visit (HOSPITAL_COMMUNITY)
Admission: RE | Admit: 2022-09-02 | Discharge: 2022-09-02 | Disposition: A | Discharge status: Home / Self Care | Payer: Medicare Other | Source: Ambulatory Visit | Attending: Internal Medicine | Admitting: Internal Medicine

## 2022-09-02 DIAGNOSIS — I671 Cerebral aneurysm, nonruptured: Secondary | ICD-10-CM | POA: Diagnosis not present

## 2022-09-02 DIAGNOSIS — I720 Aneurysm of carotid artery: Secondary | ICD-10-CM | POA: Diagnosis not present

## 2022-09-10 DIAGNOSIS — I700 Atherosclerosis of aorta: Secondary | ICD-10-CM | POA: Diagnosis not present

## 2022-09-10 DIAGNOSIS — Z79899 Other long term (current) drug therapy: Secondary | ICD-10-CM | POA: Diagnosis not present

## 2022-09-10 DIAGNOSIS — K2100 Gastro-esophageal reflux disease with esophagitis, without bleeding: Secondary | ICD-10-CM | POA: Diagnosis not present

## 2022-09-10 DIAGNOSIS — E785 Hyperlipidemia, unspecified: Secondary | ICD-10-CM | POA: Diagnosis not present

## 2022-09-10 DIAGNOSIS — F32A Depression, unspecified: Secondary | ICD-10-CM | POA: Diagnosis not present

## 2022-09-10 DIAGNOSIS — E039 Hypothyroidism, unspecified: Secondary | ICD-10-CM | POA: Diagnosis not present

## 2022-09-10 DIAGNOSIS — R002 Palpitations: Secondary | ICD-10-CM | POA: Diagnosis not present

## 2022-09-10 DIAGNOSIS — M1990 Unspecified osteoarthritis, unspecified site: Secondary | ICD-10-CM | POA: Diagnosis not present

## 2022-09-16 DIAGNOSIS — I671 Cerebral aneurysm, nonruptured: Secondary | ICD-10-CM | POA: Diagnosis not present

## 2022-09-17 ENCOUNTER — Other Ambulatory Visit (HOSPITAL_COMMUNITY): Payer: Self-pay | Admitting: Internal Medicine

## 2022-09-17 DIAGNOSIS — Z1231 Encounter for screening mammogram for malignant neoplasm of breast: Secondary | ICD-10-CM

## 2022-09-17 DIAGNOSIS — N950 Postmenopausal bleeding: Secondary | ICD-10-CM

## 2022-09-17 DIAGNOSIS — I700 Atherosclerosis of aorta: Secondary | ICD-10-CM | POA: Diagnosis not present

## 2022-09-17 DIAGNOSIS — E039 Hypothyroidism, unspecified: Secondary | ICD-10-CM | POA: Diagnosis not present

## 2022-09-17 DIAGNOSIS — I671 Cerebral aneurysm, nonruptured: Secondary | ICD-10-CM | POA: Diagnosis not present

## 2022-09-17 DIAGNOSIS — K219 Gastro-esophageal reflux disease without esophagitis: Secondary | ICD-10-CM | POA: Diagnosis not present

## 2022-09-17 DIAGNOSIS — I493 Ventricular premature depolarization: Secondary | ICD-10-CM | POA: Diagnosis not present

## 2022-09-24 DIAGNOSIS — Z23 Encounter for immunization: Secondary | ICD-10-CM | POA: Diagnosis not present

## 2023-01-05 DIAGNOSIS — K2100 Gastro-esophageal reflux disease with esophagitis, without bleeding: Secondary | ICD-10-CM | POA: Diagnosis not present

## 2023-01-05 DIAGNOSIS — I10 Essential (primary) hypertension: Secondary | ICD-10-CM | POA: Diagnosis not present

## 2023-01-05 DIAGNOSIS — E039 Hypothyroidism, unspecified: Secondary | ICD-10-CM | POA: Diagnosis not present

## 2023-01-13 DIAGNOSIS — I671 Cerebral aneurysm, nonruptured: Secondary | ICD-10-CM | POA: Diagnosis not present

## 2023-01-13 DIAGNOSIS — F325 Major depressive disorder, single episode, in full remission: Secondary | ICD-10-CM | POA: Diagnosis not present

## 2023-01-13 DIAGNOSIS — M791 Myalgia, unspecified site: Secondary | ICD-10-CM | POA: Diagnosis not present

## 2023-01-13 DIAGNOSIS — I700 Atherosclerosis of aorta: Secondary | ICD-10-CM | POA: Diagnosis not present

## 2023-05-08 DIAGNOSIS — I700 Atherosclerosis of aorta: Secondary | ICD-10-CM | POA: Diagnosis not present

## 2023-05-08 DIAGNOSIS — G4700 Insomnia, unspecified: Secondary | ICD-10-CM | POA: Diagnosis not present

## 2023-05-08 DIAGNOSIS — K2100 Gastro-esophageal reflux disease with esophagitis, without bleeding: Secondary | ICD-10-CM | POA: Diagnosis not present

## 2023-07-08 DIAGNOSIS — K2100 Gastro-esophageal reflux disease with esophagitis, without bleeding: Secondary | ICD-10-CM | POA: Diagnosis not present

## 2023-07-08 DIAGNOSIS — G4700 Insomnia, unspecified: Secondary | ICD-10-CM | POA: Diagnosis not present

## 2023-07-08 DIAGNOSIS — I700 Atherosclerosis of aorta: Secondary | ICD-10-CM | POA: Diagnosis not present

## 2023-08-07 DIAGNOSIS — Z23 Encounter for immunization: Secondary | ICD-10-CM | POA: Diagnosis not present

## 2023-09-18 DIAGNOSIS — E039 Hypothyroidism, unspecified: Secondary | ICD-10-CM | POA: Diagnosis not present

## 2023-09-18 DIAGNOSIS — Z79899 Other long term (current) drug therapy: Secondary | ICD-10-CM | POA: Diagnosis not present

## 2023-09-18 DIAGNOSIS — I700 Atherosclerosis of aorta: Secondary | ICD-10-CM | POA: Diagnosis not present

## 2023-09-18 DIAGNOSIS — F329 Major depressive disorder, single episode, unspecified: Secondary | ICD-10-CM | POA: Diagnosis not present

## 2023-09-18 DIAGNOSIS — E785 Hyperlipidemia, unspecified: Secondary | ICD-10-CM | POA: Diagnosis not present

## 2023-09-18 DIAGNOSIS — M1990 Unspecified osteoarthritis, unspecified site: Secondary | ICD-10-CM | POA: Diagnosis not present

## 2023-09-18 DIAGNOSIS — I671 Cerebral aneurysm, nonruptured: Secondary | ICD-10-CM | POA: Diagnosis not present

## 2023-09-18 DIAGNOSIS — K219 Gastro-esophageal reflux disease without esophagitis: Secondary | ICD-10-CM | POA: Diagnosis not present

## 2023-09-25 DIAGNOSIS — E039 Hypothyroidism, unspecified: Secondary | ICD-10-CM | POA: Diagnosis not present

## 2023-09-25 DIAGNOSIS — F330 Major depressive disorder, recurrent, mild: Secondary | ICD-10-CM | POA: Diagnosis not present

## 2023-09-25 DIAGNOSIS — E785 Hyperlipidemia, unspecified: Secondary | ICD-10-CM | POA: Diagnosis not present

## 2023-09-25 DIAGNOSIS — K219 Gastro-esophageal reflux disease without esophagitis: Secondary | ICD-10-CM | POA: Diagnosis not present

## 2023-09-25 DIAGNOSIS — I700 Atherosclerosis of aorta: Secondary | ICD-10-CM | POA: Diagnosis not present

## 2023-09-25 DIAGNOSIS — R002 Palpitations: Secondary | ICD-10-CM | POA: Diagnosis not present

## 2023-09-25 DIAGNOSIS — I671 Cerebral aneurysm, nonruptured: Secondary | ICD-10-CM | POA: Diagnosis not present

## 2023-12-29 ENCOUNTER — Other Ambulatory Visit (HOSPITAL_COMMUNITY): Payer: Self-pay | Admitting: Neurosurgery

## 2023-12-29 DIAGNOSIS — I671 Cerebral aneurysm, nonruptured: Secondary | ICD-10-CM

## 2024-01-09 ENCOUNTER — Ambulatory Visit (HOSPITAL_COMMUNITY)

## 2024-01-16 ENCOUNTER — Ambulatory Visit (HOSPITAL_COMMUNITY)
Admission: RE | Admit: 2024-01-16 | Discharge: 2024-01-16 | Disposition: A | Discharge status: Home / Self Care | Source: Ambulatory Visit | Attending: Neurosurgery | Admitting: Neurosurgery

## 2024-01-16 DIAGNOSIS — I671 Cerebral aneurysm, nonruptured: Secondary | ICD-10-CM | POA: Diagnosis present

## 2024-06-01 DIAGNOSIS — Z79899 Other long term (current) drug therapy: Secondary | ICD-10-CM | POA: Diagnosis not present

## 2024-06-01 DIAGNOSIS — L0390 Cellulitis, unspecified: Secondary | ICD-10-CM | POA: Diagnosis not present

## 2024-06-03 DIAGNOSIS — L0390 Cellulitis, unspecified: Secondary | ICD-10-CM | POA: Diagnosis not present

## 2024-08-18 DIAGNOSIS — K219 Gastro-esophageal reflux disease without esophagitis: Secondary | ICD-10-CM | POA: Diagnosis not present

## 2024-08-18 DIAGNOSIS — Z8249 Family history of ischemic heart disease and other diseases of the circulatory system: Secondary | ICD-10-CM | POA: Diagnosis not present

## 2024-08-18 DIAGNOSIS — I10 Essential (primary) hypertension: Secondary | ICD-10-CM | POA: Diagnosis not present

## 2024-08-18 DIAGNOSIS — F325 Major depressive disorder, single episode, in full remission: Secondary | ICD-10-CM | POA: Diagnosis not present

## 2024-08-18 DIAGNOSIS — E785 Hyperlipidemia, unspecified: Secondary | ICD-10-CM | POA: Diagnosis not present

## 2024-08-18 DIAGNOSIS — E039 Hypothyroidism, unspecified: Secondary | ICD-10-CM | POA: Diagnosis not present

## 2024-08-18 DIAGNOSIS — Z809 Family history of malignant neoplasm, unspecified: Secondary | ICD-10-CM | POA: Diagnosis not present

## 2024-08-18 DIAGNOSIS — M1990 Unspecified osteoarthritis, unspecified site: Secondary | ICD-10-CM | POA: Diagnosis not present

## 2024-08-18 DIAGNOSIS — I720 Aneurysm of carotid artery: Secondary | ICD-10-CM | POA: Diagnosis not present

## 2024-10-11 DIAGNOSIS — M810 Age-related osteoporosis without current pathological fracture: Secondary | ICD-10-CM
